# Patient Record
Sex: Female | Born: 1985 | Race: Black or African American | Hispanic: No | Marital: Single | Smoking: Current every day smoker
Health system: Southern US, Community
[De-identification: ages and names within clinical notes are randomized; demographics above are authoritative.]

## PROBLEM LIST (undated history)

## (undated) DIAGNOSIS — I10 Essential (primary) hypertension: Secondary | ICD-10-CM

## (undated) HISTORY — DX: Essential (primary) hypertension: I10

## (undated) HISTORY — PX: NO PAST SURGERIES: SHX2092

---

## 2018-02-16 ENCOUNTER — Ambulatory Visit (HOSPITAL_COMMUNITY)
Admission: EM | Admit: 2018-02-16 | Discharge: 2018-02-16 | Disposition: A | Discharge status: Home / Self Care | Payer: Medicaid Other | Attending: Nurse Practitioner | Admitting: Nurse Practitioner

## 2018-02-16 ENCOUNTER — Other Ambulatory Visit: Payer: Self-pay

## 2018-02-16 ENCOUNTER — Encounter (HOSPITAL_COMMUNITY): Payer: Self-pay

## 2018-02-16 DIAGNOSIS — N898 Other specified noninflammatory disorders of vagina: Secondary | ICD-10-CM | POA: Diagnosis present

## 2018-02-16 LAB — POCT URINALYSIS DIP (DEVICE)
BILIRUBIN URINE: NEGATIVE
GLUCOSE, UA: NEGATIVE mg/dL
KETONES UR: NEGATIVE mg/dL
Leukocytes, UA: NEGATIVE
Nitrite: NEGATIVE
Protein, ur: NEGATIVE mg/dL
SPECIFIC GRAVITY, URINE: 1.02 (ref 1.005–1.030)
Urobilinogen, UA: 1 mg/dL (ref 0.0–1.0)
pH: 7 (ref 5.0–8.0)

## 2018-02-16 MED ORDER — FLUCONAZOLE 150 MG PO TABS: 150.0000 mg | ORAL_TABLET | Freq: Every day | ORAL | 0 refills | Status: DC

## 2018-02-16 MED ORDER — METRONIDAZOLE 500 MG PO TABS: 500.0000 mg | ORAL_TABLET | Freq: Two times a day (BID) | ORAL | 0 refills | Status: DC

## 2018-02-16 NOTE — ED Provider Notes (Signed)
[facility]    CSN: [id number] Arrival date & time: 02/16/18  1641     History   Chief Complaint Chief Complaint  Patient presents with  . Vaginal Discharge    HPI Toni Maynard is a 32 y.o. female.   Subjective:   Toni Maynard is a 32 y.o. female who presents for evaluation for abnormal smelling urine, dysuria, urinary frequency, and vaginal discharge (white, thick and malodorous). Symptoms have been present for 1-2 weeks. She denies any blisters, bumps, burning, lesions, vulvar itching, back pain, fever, stomach ache or flank pain. Menstrual pattern: bleeding regularly. Contraception: none. STI Risk: Very low risk of STD exposure        History reviewed. No pertinent past medical history.  There are no active problems to display for this patient.   History reviewed. No pertinent surgical history.  OB History   None      Home Medications    Prior to Admission medications   Medication Sig Start Date End Date Taking? Authorizing Provider  fluconazole (DIFLUCAN) 150 MG tablet Take 1 tablet (150 mg total) by mouth daily. Take 1 tablet by mouth once now and repeat once in 7 days after completing antibiotics 02/16/18   Lurline Idol, FNP  metroNIDAZOLE (FLAGYL) 500 MG tablet Take 1 tablet (500 mg total) by mouth 2 (two) times daily. 02/16/18   Lurline Idol, FNP    Family History History reviewed. No pertinent family history.  Social History Social History   Tobacco Use  . Smoking status: Never Smoker  . Smokeless tobacco: Never Used  Substance Use Topics  . Alcohol use: Not on file  . Drug use: Not on file     Allergies   Patient has no allergy information on record.   Review of Systems Review of Systems  Constitutional: Negative for fever.  Gastrointestinal: Negative for nausea and vomiting.  Genitourinary: Positive for dysuria, frequency and vaginal discharge. Negative for flank pain, genital sores, hematuria, urgency  and vaginal pain.  Musculoskeletal: Negative for back pain.  All other systems reviewed and are negative.    Physical Exam Triage Vital Signs ED Triage Vitals  Enc Vitals Group     BP 02/16/18 1757 (!) 162/95     Pulse --      Resp 02/16/18 1757 16     Temp 02/16/18 1757 98.2 F (36.8 C)     Temp Source 02/16/18 1757 Oral     SpO2 02/16/18 1757 100 %     Weight 02/16/18 1759 157 lb 3.2 oz (71.3 kg)     Height --      Head Circumference --      Peak Flow --      Pain Score --      Pain Loc --      Pain Edu? --      Excl. in GC? --    No data found.  Updated Vital Signs BP (!) 162/95 (BP Location: Left Arm)   Temp 98.2 F (36.8 C) (Oral)   Resp 16   Wt 157 lb 3.2 oz (71.3 kg)   LMP 01/12/2018   SpO2 100%   Visual Acuity Right Eye Distance:   Left Eye Distance:   Bilateral Distance:    Right Eye Near:   Left Eye Near:    Bilateral Near:     Physical Exam  Constitutional: She is oriented to person, place, and time. She appears well-developed and well-nourished.  Neck: Normal range of motion.  Neck supple.  Cardiovascular: Normal rate and regular rhythm.  Pulmonary/Chest: Effort normal and breath sounds normal.  Musculoskeletal: Normal range of motion.  Neurological: She is alert and oriented to person, place, and time.  Skin: Skin is warm and dry.  Psychiatric: She has a normal mood and affect.     UC Treatments / Results  Labs (all labs ordered are listed, but only abnormal results are displayed) Labs Reviewed  POCT URINALYSIS DIP (DEVICE) - Abnormal; Notable for the following components:      Result Value   Hgb urine dipstick TRACE (*)    All other components within normal limits  URINE CYTOLOGY ANCILLARY ONLY    EKG None  Radiology No results found.  Procedures Procedures (including critical care time)  Medications Ordered in UC Medications - No data to display  Initial Impression / Assessment and Plan / UC Course  I have reviewed the  triage vital signs and the nursing notes.  Pertinent labs & imaging results that were available during my care of the patient were reviewed by me and considered in my medical decision making (see chart for details).     32 yo sexually active female presenting with UTI/vaginitis type symptoms.  Urine dipstick negative for all components.  Cytology pending for gonorrhea/chlamydia, trichomonas, yeast and gardnerella.  Plan:   Flagyl BID x 7 days  Diflucan 150 mg x 1 now and repeat once in 7 days. Abstinence from intercourse discussed. Discussed safe sex. Maintain adequate hydration Follow up if symptoms not improving, and prn.  Today's evaluation has revealed no signs of a dangerous process. Discussed diagnosis with patient. Patient aware of their diagnosis, possible red flag symptoms to watch out for and need for close follow up. Patient understands verbal and written discharge instructions. Patient comfortable with plan and disposition.  Patient has a clear mental status at this time, good insight into illness (after discussion and teaching) and has clear judgment to make decisions regarding their care.  Documentation was completed with the aid of voice recognition software. Transcription may contain typographical errors.    Final Clinical Impressions(s) / UC Diagnoses   Final diagnoses:  Vaginal discharge   Discharge Instructions   None    ED Prescriptions    Medication Sig Dispense Auth. Provider   metroNIDAZOLE (FLAGYL) 500 MG tablet Take 1 tablet (500 mg total) by mouth 2 (two) times daily. 14 tablet Lurline Idol, FNP   fluconazole (DIFLUCAN) 150 MG tablet Take 1 tablet (150 mg total) by mouth daily. Take 1 tablet by mouth once now and repeat once in 7 days after completing antibiotics 2 tablet Lurline Idol, FNP     Controlled Substance Prescriptions [place] Controlled Substance Registry consulted? Not Applicable   Lurline Idol, [place] 02/16/18 1918

## 2018-02-16 NOTE — Discharge Instructions (Addendum)
Take medications as prescribed. Testing for other infections is pending. You will only be notified for positive results. You may go online on 'my chart' to look at all your test results.

## 2018-02-16 NOTE — ED Triage Notes (Signed)
Pt states she has a vaginal discharge x 1 week

## 2018-02-17 LAB — URINE CYTOLOGY ANCILLARY ONLY
CHLAMYDIA, DNA PROBE: NEGATIVE
NEISSERIA GONORRHEA: NEGATIVE
TRICH (WINDOWPATH): NEGATIVE

## 2018-02-20 LAB — URINE CYTOLOGY ANCILLARY ONLY
Bacterial vaginitis: NEGATIVE
CANDIDA VAGINITIS: NEGATIVE

## 2018-05-14 ENCOUNTER — Other Ambulatory Visit: Payer: Self-pay

## 2018-05-14 ENCOUNTER — Encounter (HOSPITAL_COMMUNITY): Payer: Self-pay | Admitting: Emergency Medicine

## 2018-05-14 ENCOUNTER — Emergency Department (HOSPITAL_COMMUNITY)
Admission: EM | Admit: 2018-05-14 | Discharge: 2018-05-15 | Disposition: A | Discharge status: Home / Self Care | Payer: Medicaid Other | Attending: Emergency Medicine | Admitting: Emergency Medicine

## 2018-05-14 DIAGNOSIS — Z5321 Procedure and treatment not carried out due to patient leaving prior to being seen by health care provider: Secondary | ICD-10-CM | POA: Diagnosis not present

## 2018-05-14 DIAGNOSIS — R109 Unspecified abdominal pain: Secondary | ICD-10-CM | POA: Diagnosis not present

## 2018-05-14 NOTE — ED Triage Notes (Addendum)
C/o RLQ pain x 2 days.  Reports 1 episode of diarrhea yesterday and vomited x 1 tonight.  Denies urinary complaints.  States LMP started on 1/1 and lasted 3 days.

## 2018-05-15 LAB — COMPREHENSIVE METABOLIC PANEL
ALK PHOS: 39 U/L (ref 38–126)
ALT: 7 U/L (ref 0–44)
AST: 10 U/L — AB (ref 15–41)
Albumin: 3.9 g/dL (ref 3.5–5.0)
Anion gap: 8 (ref 5–15)
BILIRUBIN TOTAL: 0.5 mg/dL (ref 0.3–1.2)
BUN: <5 mg/dL — ABNORMAL LOW (ref 6–20)
CALCIUM: 8.8 mg/dL — AB (ref 8.9–10.3)
CO2: 24 mmol/L (ref 22–32)
Chloride: 107 mmol/L (ref 98–111)
Creatinine, Ser: 0.74 mg/dL (ref 0.44–1.00)
GLUCOSE: 101 mg/dL — AB (ref 70–99)
POTASSIUM: 3.2 mmol/L — AB (ref 3.5–5.1)
Sodium: 139 mmol/L (ref 135–145)
Total Protein: 7.3 g/dL (ref 6.5–8.1)

## 2018-05-15 LAB — I-STAT BETA HCG BLOOD, ED (MC, WL, AP ONLY)

## 2018-05-15 LAB — URINALYSIS, ROUTINE W REFLEX MICROSCOPIC
BILIRUBIN URINE: NEGATIVE
Glucose, UA: NEGATIVE mg/dL
HGB URINE DIPSTICK: NEGATIVE
Ketones, ur: NEGATIVE mg/dL
Nitrite: NEGATIVE
PROTEIN: NEGATIVE mg/dL
SPECIFIC GRAVITY, URINE: 1.015 (ref 1.005–1.030)
pH: 7 (ref 5.0–8.0)

## 2018-05-15 LAB — CBC
HCT: 28.2 % — ABNORMAL LOW (ref 36.0–46.0)
HEMOGLOBIN: 8.5 g/dL — AB (ref 12.0–15.0)
MCH: 21.4 pg — AB (ref 26.0–34.0)
MCHC: 30.1 g/dL (ref 30.0–36.0)
MCV: 71 fL — AB (ref 80.0–100.0)
PLATELETS: 226 10*3/uL (ref 150–400)
RBC: 3.97 MIL/uL (ref 3.87–5.11)
RDW: 21 % — ABNORMAL HIGH (ref 11.5–15.5)
WBC: 6.2 10*3/uL (ref 4.0–10.5)
nRBC: 0 % (ref 0.0–0.2)

## 2018-05-15 LAB — LIPASE, BLOOD: Lipase: 27 U/L (ref 11–51)

## 2018-05-15 NOTE — ED Notes (Signed)
Called pt last name x4 in lobby. No response from pt.

## 2018-05-25 ENCOUNTER — Ambulatory Visit
Admission: EM | Admit: 2018-05-25 | Discharge: 2018-05-25 | Disposition: A | Discharge status: Home / Self Care | Payer: Medicaid Other

## 2018-05-25 ENCOUNTER — Encounter: Payer: Self-pay | Admitting: Emergency Medicine

## 2018-05-25 DIAGNOSIS — H5440 Blindness, one eye, unspecified eye: Secondary | ICD-10-CM

## 2018-05-25 NOTE — ED Notes (Signed)
Patient able to ambulate independently  

## 2018-05-25 NOTE — ED Provider Notes (Addendum)
05/25/2018 5:57 PM   DOB: 10/17/1985 / MRN: [id number]  SUBJECTIVE:  Toni Maynard is a 33 y.o. female presenting for complete vision loss in the right eye that started about a month ago.  This was preceded by 2 weeks of progressively blurry vision.  Patient denies eye pain.  She saw Dr. at one point who told her that she had a cataract.  She has no allergies on file.   She  has no past medical history on file.    She  reports that she has been smoking. She has never used smokeless tobacco. She reports current alcohol use. She reports previous drug use. She  has no history on file for sexual activity. The patient  has no past surgical history on file.  Her family history is not on file.  Review of Systems  Constitutional: Negative for chills, diaphoresis and fever.  Gastrointestinal: Negative for nausea.  Skin: Negative for rash.  Neurological: Negative for dizziness.    OBJECTIVE:  BP 116/80 (BP Location: Left Arm)   Pulse 75   Temp 97.7 F (36.5 C)   Resp 18   LMP 05/12/2018   SpO2 98%   Wt Readings from Last 3 Encounters:  05/14/18 150 lb (68 kg)  02/16/18 157 lb 3.2 oz (71.3 kg)   Temp Readings from Last 3 Encounters:  05/25/18 97.7 F (36.5 C)  05/14/18 99.3 F (37.4 C) (Oral)  02/16/18 98.2 F (36.8 C) (Oral)   BP Readings from Last 3 Encounters:  05/25/18 116/80  05/14/18 (!) 156/96  02/16/18 (!) 162/95   Pulse Readings from Last 3 Encounters:  05/25/18 75  05/14/18 70    Physical Exam Vitals signs reviewed.  Constitutional:      General: She is not in acute distress.    Appearance: She is not diaphoretic.  Eyes:     Pupils: Pupils are equal, round, and reactive to light.   Cardiovascular:     Rate and Rhythm: Normal rate.  Pulmonary:     Effort: Pulmonary effort is normal.  Abdominal:     General: There is no distension.  Skin:    General: Skin is dry.  Neurological:     Mental Status: She is alert and oriented to person, place, and  time.     Cranial Nerves: No cranial nerve deficit.     Gait: Gait normal.     No results found for this or any previous visit (from the past 72 hour(s)).  No results found.  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 Cammie Mcgee office first thing tomorrow morning.  His telephone number is [phone].  If you feel that you need a more urgent evaluation please go to the ED tonight.        The patient is advised to call or return to clinic if she does not see an improvement in symptoms, or to seek the care of the closest emergency department if she worsens with the above plan.   Deliah Boston, MHS, PA-C 05/25/2018 5:57 PM   Ofilia Neas, PA-C 05/25/18 1757    Ofilia Neas, PA-C 05/25/18 1758

## 2018-05-25 NOTE — ED Triage Notes (Signed)
Pt presents to [facility] for assessment of lightened right pupil x 1 month with vision loss.

## 2018-05-25 NOTE — Discharge Instructions (Addendum)
Please call Dr. Cammie Mcgee office first thing tomorrow morning.  His telephone number is [phone].  If you feel that you need a more urgent evaluation please go to the ED tonight.

## 2018-10-14 LAB — OB RESULTS CONSOLE HGB/HCT, BLOOD
HCT: 26 — AB (ref 29–41)
Hemoglobin: 8.1
Hemoglobin: 8.7

## 2018-10-14 LAB — OB RESULTS CONSOLE HEPATITIS B SURFACE ANTIGEN: Hepatitis B Surface Ag: NEGATIVE

## 2018-10-14 LAB — OB RESULTS CONSOLE GC/CHLAMYDIA
Chlamydia: NEGATIVE
Gonorrhea: NEGATIVE

## 2018-10-14 LAB — DRUG SCREEN, URINE: Drug Screen, Urine: NEGATIVE

## 2018-10-14 LAB — OB RESULTS CONSOLE VARICELLA ZOSTER ANTIBODY, IGG: Varicella: IMMUNE

## 2018-10-14 LAB — CYTOLOGY - PAP: Pap: NEGATIVE

## 2018-10-14 LAB — OB RESULTS CONSOLE ANTIBODY SCREEN: Antibody Screen: NEGATIVE

## 2018-10-14 LAB — OB RESULTS CONSOLE RUBELLA ANTIBODY, IGM: Rubella: IMMUNE

## 2018-10-14 LAB — OB RESULTS CONSOLE ABO/RH: RH Type: POSITIVE

## 2018-10-14 LAB — URINE CULTURE: Urine Culture, OB: NO GROWTH

## 2018-10-14 LAB — GLUCOSE, 1 HOUR: Glucose 1 Hour: 70

## 2018-10-14 LAB — OB RESULTS CONSOLE PLATELET COUNT: Platelets: 202

## 2018-10-14 LAB — OB RESULTS CONSOLE RPR: RPR: NONREACTIVE

## 2018-10-18 LAB — VITAMIN B12
Ferritin: 8
VITAMIN B12: 407

## 2018-10-19 ENCOUNTER — Encounter: Payer: Self-pay | Admitting: *Deleted

## 2018-10-19 ENCOUNTER — Telehealth: Payer: Self-pay | Admitting: Family Medicine

## 2018-10-19 ENCOUNTER — Other Ambulatory Visit (HOSPITAL_COMMUNITY): Payer: Self-pay

## 2018-10-19 NOTE — Telephone Encounter (Signed)
Called and spoke to patient she is aware of her virtual mychart visit

## 2018-10-20 ENCOUNTER — Encounter: Payer: Self-pay | Admitting: Obstetrics and Gynecology

## 2018-10-20 ENCOUNTER — Telehealth: Payer: Self-pay | Admitting: Obstetrics and Gynecology

## 2018-10-20 ENCOUNTER — Telehealth (INDEPENDENT_AMBULATORY_CARE_PROVIDER_SITE_OTHER): Payer: Medicaid Other | Admitting: Obstetrics and Gynecology

## 2018-10-20 DIAGNOSIS — O0930 Supervision of pregnancy with insufficient antenatal care, unspecified trimester: Secondary | ICD-10-CM | POA: Insufficient documentation

## 2018-10-20 DIAGNOSIS — O09529 Supervision of elderly multigravida, unspecified trimester: Secondary | ICD-10-CM

## 2018-10-20 DIAGNOSIS — Z3A25 25 weeks gestation of pregnancy: Secondary | ICD-10-CM | POA: Diagnosis not present

## 2018-10-20 DIAGNOSIS — O0932 Supervision of pregnancy with insufficient antenatal care, second trimester: Secondary | ICD-10-CM | POA: Diagnosis not present

## 2018-10-20 DIAGNOSIS — Z641 Problems related to multiparity: Secondary | ICD-10-CM | POA: Diagnosis not present

## 2018-10-20 DIAGNOSIS — O09522 Supervision of elderly multigravida, second trimester: Secondary | ICD-10-CM

## 2018-10-20 DIAGNOSIS — N3000 Acute cystitis without hematuria: Secondary | ICD-10-CM

## 2018-10-20 DIAGNOSIS — O0990 Supervision of high risk pregnancy, unspecified, unspecified trimester: Secondary | ICD-10-CM

## 2018-10-20 DIAGNOSIS — O10013 Pre-existing essential hypertension complicating pregnancy, third trimester: Secondary | ICD-10-CM | POA: Diagnosis not present

## 2018-10-20 DIAGNOSIS — I10 Essential (primary) hypertension: Secondary | ICD-10-CM | POA: Insufficient documentation

## 2018-10-20 DIAGNOSIS — F17200 Nicotine dependence, unspecified, uncomplicated: Secondary | ICD-10-CM | POA: Insufficient documentation

## 2018-10-20 MED ORDER — ASPIRIN EC 81 MG PO TBEC: 81.0000 mg | DELAYED_RELEASE_TABLET | Freq: Every day | ORAL | 2 refills | Status: DC

## 2018-10-20 MED ORDER — CEPHALEXIN 500 MG PO CAPS: 500.0000 mg | ORAL_CAPSULE | Freq: Four times a day (QID) | ORAL | 2 refills | Status: DC

## 2018-10-20 MED ORDER — PREPLUS 27-1 MG PO TABS: 1.0000 | ORAL_TABLET | Freq: Every day | ORAL | 13 refills | Status: DC

## 2018-10-20 MED ORDER — AMBULATORY NON FORMULARY MEDICATION: 1.0000 | 0 refills | Status: DC

## 2018-10-20 NOTE — Progress Notes (Signed)
I connected with  Toni Maynard on 10/20/18 at  1:35 PM EDT by telephone and verified that I am speaking with the correct person using two identifiers.   I discussed the limitations, risks, security and privacy concerns of performing an evaluation and management service by telephone and the availability of in person appointments. I also discussed with the patient that there may be a patient responsible charge related to this service. The patient expressed understanding and agreed to proceed.  Aviva Signs Diezel Mazur, CMA 10/20/2018  1:25 PM.  Send antibiotics for UTI & prenatals to pharmacy [facility]. ([facility])

## 2018-10-20 NOTE — Telephone Encounter (Signed)
Attempted to call patient with her next ob appointment 6/25 @ 2:15. No answer, left detailed voicemail with appointment information being a virtual visit. Patient already has app downloaded from visit on 6/10. Reminder mailed

## 2018-10-20 NOTE — Progress Notes (Signed)
TELEHEALTH INITIAL PRENATAL OBSTETRICS PRENATAL VIRTUAL VIDEO VISIT ENCOUNTER NOTE  Provider location: [facility] for [place] [facility]   I connected with Toni Maynard on 10/20/18 at  1:35 PM EDT by MyChart Video Encounter at home and verified that I am speaking with the correct person using two identifiers.   I discussed the limitations, risks, security and privacy concerns of performing an evaluation and management service by telephone and the availability of in person appointments. I also discussed with the patient that there may be a patient responsible charge related to this service. The patient expressed understanding and agreed to proceed.  Subjective:  Toni Maynard is a 33 y.o. [id number] at 91w6dby 260w [place] seen today for her initial prenatal visit. [facility] is an un[facility]. She and partner are happy with the pregnancy. She was using condoms for birth control previously. She has an obstetric history significant for 8 x SVD, h/o IOL for 2 x gHTN (last two pregnancies). She has a medical history significant for n/a. Was on labetalol prior to delivery with last pregnancy, doesn't think she was ever on magnesium therapy. Was on labetalol after delivery.  Patient reports fatigue.  Contractions: Not present. Vag. Bleeding: None.  Movement: Present. Denies leaking of fluid.   History reviewed. No pertinent past medical history.  History reviewed. No pertinent surgical history.  OB History  Gravida Para Term Preterm AB Living  '10 8 7 1 ' 0 8  SAB TAB Ectopic Multiple Live Births  0 0 0 0 8    # Outcome Date GA Lbr Len/2nd Weight Sex Delivery Anes PTL Lv  10 Current           9 Preterm 07/27/17   4 lb 6 oz (1.984 kg) F Vag-Spont  N LIV  8 Term 10/14/15 354w0d5 lb 1 oz (2.296 kg) M Vag-Spont  N LIV  7 Term 07/13/11 4052w0d lb 8 oz (2.495 kg) M Vag-Spont EPI N LIV  6 Term 05/28/10 40w3w0dlb 5 oz (2.863 kg) F Vag-Spont    LIV  5 Term 03/22/07 40w025w0db 9 oz (2.523 kg) F Vag-Spont   LIV  4 Term 06/06/05 76w0d27w0d 1 oz (3.204 kg) M Vag-Spont  N LIV  3 Term 03/25/04 [redacted]w[redacted]d 30w0d6 oz (3.345 kg) F Vag-Spont  N LIV  2 Term 12/25/02 [redacted]w[redacted]d  [redacted]w[redacted]d oz (2.92 kg) M Vag-Spont  N LIV  1 Gravida             Social History   Socioeconomic History  . Marital status: Single    Spouse name: Not on file  . Number of children: Not on file  . Years of education: Not on file  . Highest education level: Not on file  Occupational History  . Not on file  Social Needs  . Financial resource strain: Not on file  . Food insecurity:    Worry: Not on file    Inability: Not on file  . Transportation needs:    Medical: Not on file    Non-medical: Not on file  Tobacco Use  . Smoking status: Current Every Day Smoker  . Smokeless tobacco: Never Used  Substance and Sexual Activity  . Alcohol use: Yes    Frequency: Never  . Drug use: Not Currently  . Sexual activity: Not on file  Lifestyle  . Physical  activity:    Days per week: Not on file    Minutes per session: Not on file  . Stress: Not on file  Relationships  . Social connections:    Talks on phone: Not on file    Gets together: Not on file    Attends religious service: Not on file    Active member of club or organization: Not on file    Attends meetings of clubs or organizations: Not on file    Relationship status: Not on file  Other Topics Concern  . Not on file  Social History Narrative  . Not on file   History reviewed. No pertinent family history.   Current Outpatient Medications:  .  AMBULATORY NON FORMULARY MEDICATION, 1 Device by Other route once a week. Blood Pressure Cuff/ Medium Monitored Regularly at home ICD 10: O09.90, Disp: 1 kit, Rfl: 0 .  aspirin EC 81 MG tablet, Take 1 tablet (81 mg total) by mouth daily. Take after 12 weeks for prevention of preeclampsia later in pregnancy, Disp: 300 tablet, Rfl: 2 .  cephALEXin (KEFLEX) 500 MG capsule,  Take 1 capsule (500 mg total) by mouth 4 (four) times daily., Disp: 28 capsule, Rfl: 2 .  fluconazole (DIFLUCAN) 150 MG tablet, Take 1 tablet (150 mg total) by mouth daily. Take 1 tablet by mouth once now and repeat once in 7 days after completing antibiotics, Disp: 2 tablet, Rfl: 0 .  metroNIDAZOLE (FLAGYL) 500 MG tablet, Take 1 tablet (500 mg total) by mouth 2 (two) times daily., Disp: 14 tablet, Rfl: 0 .  Prenatal Vit-Fe Fumarate-FA (PREPLUS) 27-1 MG TABS, Take 1 tablet by mouth daily., Disp: 30 tablet, Rfl: 13  Not on File  Review of Systems: Negative except for what is mentioned in HPI.  Objective:  There were no vitals filed for this visit.  Fetal Status:     Movement: Present     General:  Alert, oriented and cooperative. Patient is in no acute distress.  Respiratory: Normal respiratory effort, no problems with respiration noted  Mental Status: Normal mood and affect. Normal behavior. Normal judgment and thought content.  Rest of physical exam deferred due to type of encounter  Imaging: No results found.  Assessment and Plan:  Pregnancy: [id number] at 61w6dby 24 weeks [place] 1. Supervision of high risk pregnancy, antepartum - AMBULATORY NON FORMULARY MEDICATION; 1 Device by Other route once a week. Blood Pressure Cuff/ Medium Monitored Regularly at home ICD 10: O09.90  Dispense: 1 kit; Refill: 0 - [place]FM OB DETAIL +14 WK; Future [facility] - sent info for contraception via mychart - would like contraception in hospital, depo or [facility], multiple providers, fellows, medical students, virtual visits, MyChart.  - next visit virtual, visit after will be 2 hr GTT/3rd trim labs  2. Antepartum multigravida of advanced maternal age - AMBULATORY NON FORMULARY MEDICATION; 1 Device by Other route once a week. Blood Pressure Cuff/ Medium Monitored Regularly at home ICD 10: O09.90  Dispense: 1 kit;  Refill: 0  3. Acute cystitis without hematuria - Dx at NOB - Keflex sent to pharmacy  4. Late prenatal care  5. [place]iparity  6. Chronic hypertension - Start baby ASA - based on labetalol use in last pregnancy and elevated BP outside of pregnancy - no meds - will need testing 34 weeks   Preterm labor symptoms and general obstetric precautions including  but not limited to vaginal bleeding, contractions, leaking of fluid and fetal movement were reviewed in detail with the patient. I discussed the assessment and treatment plan with the patient. The patient was provided an opportunity to ask questions and all were answered. The patient agreed with the plan and demonstrated an understanding of the instructions. The patient was advised to call back or seek an in-person office evaluation/go to MAU at [facility] for any urgent or concerning symptoms. Please refer to After Visit Summary for other counseling recommendations.   I provided 16 minutes of face-to-face time during this encounter.  Return in about 2 weeks (around 11/03/2018) for OB visit (MD), virtual.  Sloan Leiter 10/20/2018 1:56 PM

## 2018-10-22 ENCOUNTER — Encounter: Payer: Self-pay | Admitting: *Deleted

## 2018-10-22 DIAGNOSIS — I10 Essential (primary) hypertension: Secondary | ICD-10-CM

## 2018-10-25 ENCOUNTER — Encounter: Payer: Self-pay | Admitting: *Deleted

## 2018-11-03 ENCOUNTER — Encounter: Payer: Self-pay | Admitting: *Deleted

## 2018-11-03 ENCOUNTER — Telehealth: Payer: Self-pay | Admitting: Obstetrics & Gynecology

## 2018-11-03 NOTE — Telephone Encounter (Signed)
Called number in [place], and it's the number to the health dept.

## 2018-11-04 ENCOUNTER — Telehealth (INDEPENDENT_AMBULATORY_CARE_PROVIDER_SITE_OTHER): Payer: Medicaid Other | Admitting: Obstetrics & Gynecology

## 2018-11-04 ENCOUNTER — Other Ambulatory Visit: Payer: Self-pay

## 2018-11-04 VITALS — BP 131/91 | HR 64

## 2018-11-04 DIAGNOSIS — O0993 Supervision of high risk pregnancy, unspecified, third trimester: Secondary | ICD-10-CM

## 2018-11-04 DIAGNOSIS — O0990 Supervision of high risk pregnancy, unspecified, unspecified trimester: Secondary | ICD-10-CM

## 2018-11-04 DIAGNOSIS — O10013 Pre-existing essential hypertension complicating pregnancy, third trimester: Secondary | ICD-10-CM

## 2018-11-04 DIAGNOSIS — Z3A28 28 weeks gestation of pregnancy: Secondary | ICD-10-CM | POA: Diagnosis not present

## 2018-11-04 DIAGNOSIS — I10 Essential (primary) hypertension: Secondary | ICD-10-CM

## 2018-11-04 NOTE — Patient Instructions (Signed)
Return to office for any scheduled appointments. Call the office or go to the MAU at [facility] [place] if:  You begin to have strong, frequent contractions  Your water breaks.  Sometimes it is a big gush of fluid, sometimes it is just a trickle that keeps getting your panties wet or running down your legs  You have vaginal bleeding.  It is normal to have a small amount of spotting if your cervix was checked.   You do not feel your baby moving like normal.  If you do not, get something to eat and drink and lay down and focus on feeling your baby move.   If your baby is still not moving like normal, you should call the office or go to MAU.  Any other obstetric concerns.   

## 2018-11-04 NOTE — Progress Notes (Signed)
Called pt and LVM informing pt of anatomy ultrasound scheduled for 6/29 @ 9:30 am. Pt was also told to download babyscripts. Pt was instructed to give the office a call if she had any further questions or concerns.

## 2018-11-04 NOTE — Progress Notes (Signed)
   [place] VIRTUAL VIDEO VISIT ENCOUNTER NOTE  Provider location: [facility] for [place] at [facility]   I connected with Leona Singleton on 11/04/18 at  2:15 PM EDT by MyChart Video Encounter at home and verified that I am speaking with the correct person using two identifiers.   I discussed the limitations, risks, security and privacy concerns of performing an evaluation and management service by telephone and the availability of in person appointments. I also discussed with the patient that there may be a patient responsible charge related to this service. The patient expressed understanding and agreed to proceed. Subjective:  Toni Maynard is a 33 y.o. [id number] at [redacted]w[redacted]d being seen today for ongoing prenatal care.  She is currently monitored for the following issues for this high-risk pregnancy and has Supervision of high risk pregnancy, antepartum; Advanced maternal age in multigravida; Late prenatal care; [place] multiparity; Chronic hypertension; and Smoker on their problem list.  Patient reports no complaints.  Contractions: Not present. Vag. Bleeding: None.  Movement: Present. Denies any leaking of fluid.   The following portions of the patient's history were reviewed and updated as appropriate: allergies, current medications, past family history, past medical history, past social history, past surgical history and problem list.   Objective:   Vitals:   11/04/18 1319  BP: (!) 131/91  Pulse: 64    Fetal Status:     Movement: Present     General:  Alert, oriented and cooperative. Patient is in no acute distress.  Respiratory: Normal respiratory effort, no problems with respiration noted  Mental Status: Normal mood and affect. Normal behavior. Normal judgment and thought content.  Rest of physical exam deferred due to type of encounter  Imaging: No results found.  Assessment and Plan:  Pregnancy: [id number] at [redacted]w[redacted]d 1. Chronic hypertension  Will follow up BP closely, preeclampsia precautions reviewed. Will get growth scan. Antenatal testing to start in 32 weeks.  - Babyscripts Schedule Optimization - Enroll Patient in Babyscripts - [place] MFM OB DETAIL +14 WK; Future  2. Supervision of high risk pregnancy, antepartum Preterm labor symptoms and general obstetric precautions including but not limited to vaginal bleeding, contractions, leaking of fluid and fetal movement were reviewed in detail with the patient. I discussed the assessment and treatment plan with the patient. The patient was provided an opportunity to ask questions and all were answered. The patient agreed with the plan and demonstrated an understanding of the instructions. The patient was advised to call back or seek an in-person office evaluation/go to MAU at [facility] for any urgent or concerning symptoms. Please refer to After Visit Summary for other counseling recommendations.   I provided 15 minutes of face-to-face time during this encounter.  Return in about 2 weeks (around 11/18/2018) for Virtual OB Visit.  Future Appointments  Date Time Provider [place]  11/04/2018  2:15 PM Mathew Storck, Sallyanne Havers, MD [place]    Verita Schneiders, MD Center for [facility], [place]

## 2018-11-08 ENCOUNTER — Other Ambulatory Visit (HOSPITAL_COMMUNITY): Payer: Self-pay | Admitting: *Deleted

## 2018-11-08 ENCOUNTER — Ambulatory Visit (HOSPITAL_COMMUNITY): Payer: Medicaid Other | Admitting: *Deleted

## 2018-11-08 ENCOUNTER — Ambulatory Visit (HOSPITAL_COMMUNITY)
Admission: RE | Admit: 2018-11-08 | Discharge: 2018-11-08 | Disposition: A | Discharge status: Home / Self Care | Payer: Medicaid Other | Source: Ambulatory Visit | Attending: Obstetrics & Gynecology | Admitting: Obstetrics & Gynecology

## 2018-11-08 ENCOUNTER — Other Ambulatory Visit: Payer: Self-pay | Admitting: Obstetrics & Gynecology

## 2018-11-08 ENCOUNTER — Encounter: Payer: Self-pay | Admitting: Obstetrics & Gynecology

## 2018-11-08 ENCOUNTER — Other Ambulatory Visit: Payer: Self-pay

## 2018-11-08 ENCOUNTER — Encounter (HOSPITAL_COMMUNITY): Payer: Self-pay

## 2018-11-08 VITALS — BP 124/76 | HR 71 | Temp 98.5°F

## 2018-11-08 DIAGNOSIS — O10919 Unspecified pre-existing hypertension complicating pregnancy, unspecified trimester: Secondary | ICD-10-CM

## 2018-11-08 DIAGNOSIS — Z3A28 28 weeks gestation of pregnancy: Secondary | ICD-10-CM

## 2018-11-08 DIAGNOSIS — O0943 Supervision of pregnancy with grand multiparity, third trimester: Secondary | ICD-10-CM | POA: Diagnosis not present

## 2018-11-08 DIAGNOSIS — O99013 Anemia complicating pregnancy, third trimester: Secondary | ICD-10-CM | POA: Insufficient documentation

## 2018-11-08 DIAGNOSIS — Z641 Problems related to multiparity: Secondary | ICD-10-CM

## 2018-11-08 DIAGNOSIS — O09293 Supervision of pregnancy with other poor reproductive or obstetric history, third trimester: Secondary | ICD-10-CM

## 2018-11-08 DIAGNOSIS — O0933 Supervision of pregnancy with insufficient antenatal care, third trimester: Secondary | ICD-10-CM | POA: Diagnosis not present

## 2018-11-08 DIAGNOSIS — O09213 Supervision of pregnancy with history of pre-term labor, third trimester: Secondary | ICD-10-CM

## 2018-11-08 DIAGNOSIS — O09523 Supervision of elderly multigravida, third trimester: Secondary | ICD-10-CM

## 2018-11-08 DIAGNOSIS — I10 Essential (primary) hypertension: Secondary | ICD-10-CM | POA: Diagnosis not present

## 2018-11-08 DIAGNOSIS — O10013 Pre-existing essential hypertension complicating pregnancy, third trimester: Secondary | ICD-10-CM | POA: Diagnosis not present

## 2018-11-08 DIAGNOSIS — O99333 Smoking (tobacco) complicating pregnancy, third trimester: Secondary | ICD-10-CM

## 2018-11-08 DIAGNOSIS — Z363 Encounter for antenatal screening for malformations: Secondary | ICD-10-CM

## 2018-11-08 NOTE — Progress Notes (Signed)
Anemia Management     Component Value Date/Time   HGB 8.5 05/14/2018   HGB 8.1 10/14/2018   Was on oral iron therapy, Feraheme ordered given continued anemia in third trimester and increased risk of PPH given multiparity. Will recheck in about 4-6 weeks.   Toni Schneiders, MD, [place] for [employer], [place]

## 2018-11-11 ENCOUNTER — Telehealth (INDEPENDENT_AMBULATORY_CARE_PROVIDER_SITE_OTHER): Payer: Medicaid Other | Admitting: Family Medicine

## 2018-11-11 DIAGNOSIS — O99013 Anemia complicating pregnancy, third trimester: Secondary | ICD-10-CM

## 2018-11-11 NOTE — Telephone Encounter (Signed)
Called patient and reviewed appt information for Monday and directions. Patient verbalized understanding & states someone has already called her. Patient had no questions.

## 2018-11-11 NOTE — Telephone Encounter (Signed)
Patient has an appointment on Monday, and she needs to know what kind of appointment it is. Where she needs to go, and why she needs it. Please call back.

## 2018-11-15 ENCOUNTER — Other Ambulatory Visit: Payer: Self-pay

## 2018-11-15 ENCOUNTER — Ambulatory Visit (HOSPITAL_COMMUNITY)
Admission: RE | Admit: 2018-11-15 | Discharge: 2018-11-15 | Disposition: A | Discharge status: Home / Self Care | Payer: Medicaid Other | Source: Ambulatory Visit | Attending: Obstetrics & Gynecology | Admitting: Obstetrics & Gynecology

## 2018-11-15 DIAGNOSIS — O99013 Anemia complicating pregnancy, third trimester: Secondary | ICD-10-CM | POA: Diagnosis present

## 2018-11-15 MED ORDER — SODIUM CHLORIDE 0.9 % IV SOLN
510.0000 mg | INTRAVENOUS | Status: DC
  Administered 2018-11-15: 510 mg via INTRAVENOUS
  Filled 2018-11-15: qty 17

## 2018-11-17 ENCOUNTER — Encounter: Payer: Self-pay | Admitting: *Deleted

## 2018-11-17 ENCOUNTER — Telehealth: Payer: Self-pay | Admitting: Obstetrics & Gynecology

## 2018-11-17 NOTE — Telephone Encounter (Signed)
Left a voicemail message instructing the patient of how enter the virtual visit via mychart. Informed the patient of logging in 15 minutes prior to the appointment and if she has any questions please call our office at [phone]. °

## 2018-11-18 ENCOUNTER — Encounter: Payer: Self-pay | Admitting: Family Medicine

## 2018-11-18 ENCOUNTER — Telehealth (INDEPENDENT_AMBULATORY_CARE_PROVIDER_SITE_OTHER): Payer: Medicaid Other | Admitting: Family Medicine

## 2018-11-18 ENCOUNTER — Other Ambulatory Visit: Payer: Self-pay

## 2018-11-18 VITALS — BP 131/99

## 2018-11-18 DIAGNOSIS — O0993 Supervision of high risk pregnancy, unspecified, third trimester: Secondary | ICD-10-CM

## 2018-11-18 DIAGNOSIS — I10 Essential (primary) hypertension: Secondary | ICD-10-CM

## 2018-11-18 DIAGNOSIS — Z3A30 30 weeks gestation of pregnancy: Secondary | ICD-10-CM | POA: Diagnosis not present

## 2018-11-18 DIAGNOSIS — O163 Unspecified maternal hypertension, third trimester: Secondary | ICD-10-CM

## 2018-11-18 DIAGNOSIS — O0990 Supervision of high risk pregnancy, unspecified, unspecified trimester: Secondary | ICD-10-CM

## 2018-11-18 DIAGNOSIS — Z641 Problems related to multiparity: Secondary | ICD-10-CM | POA: Diagnosis not present

## 2018-11-18 NOTE — Patient Instructions (Signed)

## 2018-11-18 NOTE — Progress Notes (Signed)
Patient denies headaches, dizziness, or blurry vision

## 2018-11-18 NOTE — Progress Notes (Signed)
   TELEHEALTH VIRTUAL OBSTETRICS VISIT ENCOUNTER NOTE  I connected with Toni Maynard on 11/18/18 at 11:15 AM EDT by telephone (could not get MyChart to work) at home and verified that I am speaking with the correct person using two identifiers.   I discussed the limitations, risks, security and privacy concerns of performing an evaluation and management service by telephone and the availability of in person appointments. I also discussed with the patient that there may be a patient responsible charge related to this service. The patient expressed understanding and agreed to proceed.  Subjective:  Toni Maynard is a 33 y.o. [phone] at [redacted]w[redacted]d being followed for ongoing prenatal care.  She is currently monitored for the following issues for this high-risk pregnancy and has Supervision of high risk pregnancy, antepartum; Advanced maternal age in multigravida; Late prenatal care; [place] multiparity; Chronic hypertension; Smoker; and Anemia of pregnancy, third trimester on their problem list.  Patient reports no complaints. Reports fetal movement. Denies any contractions, bleeding or leaking of fluid.   The following portions of the patient's history were reviewed and updated as appropriate: allergies, current medications, past family history, past medical history, past social history, past surgical history and problem list.   Objective:   General:  Alert, oriented and cooperative.   Mental Status: Normal mood and affect perceived. Normal judgment and thought content.  Rest of physical exam deferred due to type of encounter  Assessment and Plan:  Pregnancy: [id number] at [redacted]w[redacted]d 1. Chronic hypertension BP reviewed and ok for [facility] On no meds--antenatal testing at 36 wks [employer] reviewed--not using, but will work on putting BPs in  2. Supervision of high risk pregnancy, antepartum Needs 2 hour  3. [place] multiparity   Preterm labor symptoms and general obstetric precautions  including but not limited to vaginal bleeding, contractions, leaking of fluid and fetal movement were reviewed in detail with the patient.  I discussed the assessment and treatment plan with the patient. The patient was provided an opportunity to ask questions and all were answered. The patient agreed with the plan and demonstrated an understanding of the instructions. The patient was advised to call back or seek an in-person office evaluation/go to MAU at [facility] for any urgent or concerning symptoms. Please refer to After Visit Summary for other counseling recommendations.   I provided 8 minutes of non-face-to-face time during this encounter.  Return in 2 weeks (on 12/02/2018) for virtual.  Future Appointments  Date Time Provider [place]  11/22/2018 11:00 AM [facility]-MD[facility] ROOM 9 [facility]-MD[facility] None  11/25/2018  8:15 AM Emily Filbert, MD WOC-WOCA WOC  11/25/2018  8:40 AM WOC-WOCA LAB WOC-WOCA WOC  12/02/2018  9:15 AM Lavonia Drafts, MD WOC-WOCA WOC  12/06/2018  2:00 PM Claude NURSE [place] MFC-US  12/06/2018  2:00 PM [place] 3 WH-MFCUS MFC-US  12/15/2018  9:15 AM Lavonia Drafts, MD WOC-WOCA WOC  12/22/2018  9:35 AM Emily Filbert, MD WOC-WOCA WOC  01/05/2019  8:15 AM WOC-WOCA NST WOC-WOCA WOC  01/05/2019  9:15 AM Donnamae Jude, MD House  01/12/2019  8:15 AM WOC-WOCA NST WOC-WOCA WOC  01/19/2019  8:15 AM WOC-WOCA NST WOC-WOCA WOC  01/26/2019  8:15 AM WOC-WOCA NST WOC-WOCA WOC    Donnamae Jude, MD Center for [place] [place] [facility]

## 2018-11-22 ENCOUNTER — Other Ambulatory Visit: Payer: Self-pay | Admitting: *Deleted

## 2018-11-22 ENCOUNTER — Ambulatory Visit (HOSPITAL_COMMUNITY)
Admission: RE | Admit: 2018-11-22 | Discharge: 2018-11-22 | Disposition: A | Discharge status: Home / Self Care | Payer: Medicaid Other | Source: Ambulatory Visit | Attending: Obstetrics & Gynecology | Admitting: Obstetrics & Gynecology

## 2018-11-22 ENCOUNTER — Other Ambulatory Visit: Payer: Self-pay

## 2018-11-22 DIAGNOSIS — O09529 Supervision of elderly multigravida, unspecified trimester: Secondary | ICD-10-CM

## 2018-11-22 DIAGNOSIS — O99013 Anemia complicating pregnancy, third trimester: Secondary | ICD-10-CM | POA: Diagnosis present

## 2018-11-22 DIAGNOSIS — O0990 Supervision of high risk pregnancy, unspecified, unspecified trimester: Secondary | ICD-10-CM

## 2018-11-22 MED ORDER — SODIUM CHLORIDE 0.9 % IV SOLN
510.0000 mg | INTRAVENOUS | Status: AC
  Administered 2018-11-22: 510 mg via INTRAVENOUS
  Filled 2018-11-22: qty 17

## 2018-11-25 ENCOUNTER — Encounter: Payer: Medicaid Other | Admitting: Obstetrics & Gynecology

## 2018-11-25 ENCOUNTER — Telehealth: Payer: Self-pay | Admitting: Obstetrics & Gynecology

## 2018-11-25 ENCOUNTER — Other Ambulatory Visit: Payer: Self-pay

## 2018-11-25 ENCOUNTER — Other Ambulatory Visit: Payer: Medicaid Other

## 2018-11-25 DIAGNOSIS — O0990 Supervision of high risk pregnancy, unspecified, unspecified trimester: Secondary | ICD-10-CM

## 2018-11-25 DIAGNOSIS — O09529 Supervision of elderly multigravida, unspecified trimester: Secondary | ICD-10-CM

## 2018-11-25 NOTE — Telephone Encounter (Signed)
Patient called in stating that she overslept for her appointment and wanted to know what she needed to do. Patient instructed that she would have to be rescheduled for her doctors appointment but she could still come in this morning to complete her 2 hr sugar test. Patient stated that she would like both of the visit together if possible. Patient instructed that our next available appointment was on 7/24 so I could cancel her virtual appointment on 7/23 and scheduled everything there. Patient stated that she did not want to do that. Patient instructed she could keep her appointment on 7/23 and come in today to go the sugar test. Patient stated that she would do that if she makes it here.

## 2018-11-26 LAB — RPR: RPR Ser Ql: NONREACTIVE

## 2018-11-26 LAB — CBC
Hematocrit: 27.8 % — ABNORMAL LOW (ref 34.0–46.6)
Hemoglobin: 9.1 g/dL — ABNORMAL LOW (ref 11.1–15.9)
MCH: 27.7 pg (ref 26.6–33.0)
MCHC: 32.7 g/dL (ref 31.5–35.7)
MCV: 85 fL (ref 79–97)
Platelets: 177 10*3/uL (ref 150–450)
RBC: 3.28 x10E6/uL — ABNORMAL LOW (ref 3.77–5.28)
RDW: 17.5 % — ABNORMAL HIGH (ref 11.7–15.4)
WBC: 11.4 10*3/uL — ABNORMAL HIGH (ref 3.4–10.8)

## 2018-11-26 LAB — GLUCOSE TOLERANCE, 2 HOURS W/ 1HR
Glucose, 1 hour: 129 mg/dL (ref 65–179)
Glucose, 2 hour: 95 mg/dL (ref 65–152)
Glucose, Fasting: 79 mg/dL (ref 65–91)

## 2018-11-26 LAB — HIV ANTIBODY (ROUTINE TESTING W REFLEX): HIV Screen 4th Generation wRfx: NONREACTIVE

## 2018-12-02 ENCOUNTER — Telehealth: Payer: Self-pay | Admitting: Obstetrics & Gynecology

## 2018-12-02 ENCOUNTER — Other Ambulatory Visit: Payer: Self-pay

## 2018-12-02 ENCOUNTER — Encounter: Payer: Medicaid Other | Admitting: Obstetrics & Gynecology

## 2018-12-02 ENCOUNTER — Encounter: Payer: Self-pay | Admitting: Obstetrics & Gynecology

## 2018-12-02 NOTE — Progress Notes (Signed)
0935-attempted to contact pt to begin virtual visit. LVM informing pt of attempt and encouraged pt to give the office a call.   0946- second attempt to contact pt. LVM regarding the need for the appointment to be rescheduled. Encouraged pt to give the office a phone call.

## 2018-12-02 NOTE — Telephone Encounter (Signed)
Attempted to call patient to get her rescheduled for her missed ob appointment on 7/23. No answer, left voicemail for patient to give the office a call back to be rescheduled. No show letter mailed.

## 2018-12-02 NOTE — Telephone Encounter (Signed)
Patient called to say she had missed her appointment. She knows she has an ultrasound appointment on Monday.

## 2018-12-06 ENCOUNTER — Ambulatory Visit (HOSPITAL_COMMUNITY): Payer: Medicaid Other

## 2018-12-07 ENCOUNTER — Ambulatory Visit (HOSPITAL_COMMUNITY)
Admission: RE | Admit: 2018-12-07 | Discharge: 2018-12-07 | Disposition: A | Discharge status: Home / Self Care | Payer: Medicaid Other | Source: Ambulatory Visit | Attending: Obstetrics and Gynecology | Admitting: Obstetrics and Gynecology

## 2018-12-07 ENCOUNTER — Ambulatory Visit (HOSPITAL_COMMUNITY): Payer: Medicaid Other | Admitting: *Deleted

## 2018-12-07 ENCOUNTER — Telehealth: Payer: Self-pay | Admitting: General Practice

## 2018-12-07 ENCOUNTER — Encounter (HOSPITAL_COMMUNITY): Payer: Self-pay | Admitting: *Deleted

## 2018-12-07 ENCOUNTER — Other Ambulatory Visit (HOSPITAL_COMMUNITY): Payer: Self-pay | Admitting: *Deleted

## 2018-12-07 ENCOUNTER — Other Ambulatory Visit: Payer: Self-pay

## 2018-12-07 DIAGNOSIS — O0943 Supervision of pregnancy with grand multiparity, third trimester: Secondary | ICD-10-CM

## 2018-12-07 DIAGNOSIS — O10013 Pre-existing essential hypertension complicating pregnancy, third trimester: Secondary | ICD-10-CM | POA: Diagnosis not present

## 2018-12-07 DIAGNOSIS — O0933 Supervision of pregnancy with insufficient antenatal care, third trimester: Secondary | ICD-10-CM

## 2018-12-07 DIAGNOSIS — O10919 Unspecified pre-existing hypertension complicating pregnancy, unspecified trimester: Secondary | ICD-10-CM | POA: Insufficient documentation

## 2018-12-07 DIAGNOSIS — Z363 Encounter for antenatal screening for malformations: Secondary | ICD-10-CM

## 2018-12-07 DIAGNOSIS — I10 Essential (primary) hypertension: Secondary | ICD-10-CM | POA: Insufficient documentation

## 2018-12-07 DIAGNOSIS — O99333 Smoking (tobacco) complicating pregnancy, third trimester: Secondary | ICD-10-CM

## 2018-12-07 DIAGNOSIS — O09523 Supervision of elderly multigravida, third trimester: Secondary | ICD-10-CM

## 2018-12-07 DIAGNOSIS — O09293 Supervision of pregnancy with other poor reproductive or obstetric history, third trimester: Secondary | ICD-10-CM

## 2018-12-07 DIAGNOSIS — O09213 Supervision of pregnancy with history of pre-term labor, third trimester: Secondary | ICD-10-CM

## 2018-12-07 DIAGNOSIS — Z3A32 32 weeks gestation of pregnancy: Secondary | ICD-10-CM

## 2018-12-07 NOTE — Telephone Encounter (Signed)
Received phone call from [employer] due to elevated blood pressure of 133/90. Per chart review, patient was seen in MFM today and has a hx of chronic HTN- no intervention needed at this time per Dr Juleen China.

## 2018-12-10 ENCOUNTER — Telehealth: Payer: Self-pay | Admitting: General Practice

## 2018-12-10 NOTE — Telephone Encounter (Signed)
Received alert from [employer] patient had elevated BP 143/94 with no headaches, dizziness, blurry vision, or abdominal pain. Per chart review, patient has hx of chronic HTN no meds- next OB visit is 8/5. Per Dr Harolyn Rutherford, no intervention needed at this time.

## 2018-12-13 ENCOUNTER — Other Ambulatory Visit: Payer: Self-pay | Admitting: Obstetrics & Gynecology

## 2018-12-13 ENCOUNTER — Encounter: Payer: Self-pay | Admitting: *Deleted

## 2018-12-13 NOTE — Progress Notes (Signed)
[employer] reports 150/107 BP. Message sent to Clinical pool to contact patient tomorrow and determine if she needs to be seen before her appointment 12/15/2018

## 2018-12-14 ENCOUNTER — Encounter (HOSPITAL_COMMUNITY): Payer: Self-pay

## 2018-12-14 ENCOUNTER — Inpatient Hospital Stay (HOSPITAL_COMMUNITY)
Admission: AD | Admit: 2018-12-14 | Discharge: 2018-12-18 | DRG: 787 | Disposition: A | Discharge status: Home / Self Care | Payer: Medicaid Other | Attending: Obstetrics and Gynecology | Admitting: Obstetrics and Gynecology

## 2018-12-14 ENCOUNTER — Other Ambulatory Visit: Payer: Self-pay

## 2018-12-14 DIAGNOSIS — Z3A33 33 weeks gestation of pregnancy: Secondary | ICD-10-CM

## 2018-12-14 DIAGNOSIS — D649 Anemia, unspecified: Secondary | ICD-10-CM | POA: Diagnosis present

## 2018-12-14 DIAGNOSIS — E876 Hypokalemia: Secondary | ICD-10-CM | POA: Diagnosis present

## 2018-12-14 DIAGNOSIS — O1410 Severe pre-eclampsia, unspecified trimester: Secondary | ICD-10-CM | POA: Diagnosis present

## 2018-12-14 DIAGNOSIS — Z8759 Personal history of other complications of pregnancy, childbirth and the puerperium: Secondary | ICD-10-CM | POA: Diagnosis present

## 2018-12-14 DIAGNOSIS — O9902 Anemia complicating childbirth: Secondary | ICD-10-CM | POA: Diagnosis present

## 2018-12-14 DIAGNOSIS — Z20828 Contact with and (suspected) exposure to other viral communicable diseases: Secondary | ICD-10-CM | POA: Diagnosis present

## 2018-12-14 DIAGNOSIS — O114 Pre-existing hypertension with pre-eclampsia, complicating childbirth: Principal | ICD-10-CM | POA: Diagnosis present

## 2018-12-14 DIAGNOSIS — O26899 Other specified pregnancy related conditions, unspecified trimester: Secondary | ICD-10-CM | POA: Diagnosis present

## 2018-12-14 DIAGNOSIS — O9912 Other diseases of the blood and blood-forming organs and certain disorders involving the immune mechanism complicating childbirth: Secondary | ICD-10-CM | POA: Diagnosis present

## 2018-12-14 DIAGNOSIS — D696 Thrombocytopenia, unspecified: Secondary | ICD-10-CM | POA: Diagnosis present

## 2018-12-14 DIAGNOSIS — O99119 Other diseases of the blood and blood-forming organs and certain disorders involving the immune mechanism complicating pregnancy, unspecified trimester: Secondary | ICD-10-CM | POA: Diagnosis present

## 2018-12-14 DIAGNOSIS — F17210 Nicotine dependence, cigarettes, uncomplicated: Secondary | ICD-10-CM | POA: Diagnosis present

## 2018-12-14 DIAGNOSIS — D6959 Other secondary thrombocytopenia: Secondary | ICD-10-CM | POA: Diagnosis present

## 2018-12-14 DIAGNOSIS — O1002 Pre-existing essential hypertension complicating childbirth: Secondary | ICD-10-CM

## 2018-12-14 DIAGNOSIS — O99334 Smoking (tobacco) complicating childbirth: Secondary | ICD-10-CM | POA: Diagnosis present

## 2018-12-14 DIAGNOSIS — O1490 Unspecified pre-eclampsia, unspecified trimester: Secondary | ICD-10-CM | POA: Diagnosis present

## 2018-12-14 LAB — COMPREHENSIVE METABOLIC PANEL
ALT: 8 U/L (ref 0–44)
AST: 11 U/L — ABNORMAL LOW (ref 15–41)
Albumin: 2.8 g/dL — ABNORMAL LOW (ref 3.5–5.0)
Alkaline Phosphatase: 61 U/L (ref 38–126)
Anion gap: 9 (ref 5–15)
BUN: <5 mg/dL — ABNORMAL LOW (ref 6–20)
CO2: 23 mmol/L (ref 22–32)
Calcium: 8.3 mg/dL — ABNORMAL LOW (ref 8.9–10.3)
Chloride: 105 mmol/L (ref 98–111)
Creatinine, Ser: 0.61 mg/dL (ref 0.44–1.00)
GFR calc Af Amer: >60 mL/min (ref >60)
GFR calc non Af Amer: >60 mL/min (ref >60)
Glucose, Bld: 81 mg/dL (ref 70–99)
Potassium: 2.9 mmol/L — ABNORMAL LOW (ref 3.5–5.1)
Sodium: 137 mmol/L (ref 135–145)
Total Bilirubin: 0.3 mg/dL (ref 0.3–1.2)
Total Protein: 6 g/dL — ABNORMAL LOW (ref 6.5–8.1)

## 2018-12-14 LAB — CBC
HCT: 29.5 % — ABNORMAL LOW (ref 36.0–46.0)
HCT: 30.7 % — ABNORMAL LOW (ref 36.0–46.0)
Hemoglobin: 9.9 g/dL — ABNORMAL LOW (ref 12.0–15.0)
Hemoglobin: 10.2 g/dL — ABNORMAL LOW (ref 12.0–15.0)
MCH: 29.5 pg (ref 26.0–34.0)
MCH: 29.5 pg (ref 26.0–34.0)
MCHC: 33.6 g/dL (ref 30.0–36.0)
MCHC: 33.2 g/dL (ref 30.0–36.0)
MCV: 87.8 fL (ref 80.0–100.0)
MCV: 88.7 fL (ref 80.0–100.0)
Platelets: 132 10*3/uL — ABNORMAL LOW (ref 150–400)
Platelets: 140 10*3/uL — ABNORMAL LOW (ref 150–400)
RBC: 3.36 MIL/uL — ABNORMAL LOW (ref 3.87–5.11)
RBC: 3.46 MIL/uL — ABNORMAL LOW (ref 3.87–5.11)
RDW: 16.2 % — ABNORMAL HIGH (ref 11.5–15.5)
RDW: 16.3 % — ABNORMAL HIGH (ref 11.5–15.5)
WBC: 9.2 10*3/uL (ref 4.0–10.5)
WBC: 9.3 10*3/uL (ref 4.0–10.5)
nRBC: 0 % (ref 0.0–0.2)
nRBC: 0 % (ref 0.0–0.2)

## 2018-12-14 LAB — PROTEIN / CREATININE RATIO, URINE
Creatinine, Urine: 237.62 mg/dL
Protein Creatinine Ratio: 0.17 mg/mg{Cre} — ABNORMAL HIGH (ref 0.00–0.15)
Total Protein, Urine: 41 mg/dL

## 2018-12-14 LAB — TYPE AND SCREEN
ABO/RH(D): B POS
Antibody Screen: NEGATIVE

## 2018-12-14 MED ORDER — SODIUM CHLORIDE 0.9% FLUSH: 3.0000 mL | Freq: Two times a day (BID) | INTRAVENOUS | Status: DC

## 2018-12-14 MED ORDER — ENOXAPARIN SODIUM 40 MG/0.4ML ~~LOC~~ SOLN
40.0000 mg | SUBCUTANEOUS | Status: DC
  Administered 2018-12-14: 40 mg via SUBCUTANEOUS
  Filled 2018-12-14: qty 0.4

## 2018-12-14 MED ORDER — MAGNESIUM SULFATE BOLUS VIA INFUSION
4.0000 g | Freq: Once | INTRAVENOUS | Status: AC
  Administered 2018-12-14: 4 g via INTRAVENOUS
  Filled 2018-12-14: qty 500

## 2018-12-14 MED ORDER — DOCUSATE SODIUM 100 MG PO CAPS
100.0000 mg | ORAL_CAPSULE | Freq: Every day | ORAL | Status: DC
  Administered 2018-12-15: 100 mg via ORAL
  Filled 2018-12-14: qty 1

## 2018-12-14 MED ORDER — FERROUS SULFATE 325 (65 FE) MG PO TABS
325.0000 mg | ORAL_TABLET | Freq: Two times a day (BID) | ORAL | Status: DC
  Administered 2018-12-14: 325 mg via ORAL
  Filled 2018-12-14: qty 1

## 2018-12-14 MED ORDER — BUTALBITAL-APAP-CAFFEINE 50-325-40 MG PO TABS
2.0000 | ORAL_TABLET | Freq: Once | ORAL | Status: AC
  Administered 2018-12-14: 18:00:00 2 via ORAL
  Filled 2018-12-14: qty 2

## 2018-12-14 MED ORDER — ZOLPIDEM TARTRATE 5 MG PO TABS: 5.0000 mg | ORAL_TABLET | Freq: Every evening | ORAL | Status: DC | PRN

## 2018-12-14 MED ORDER — SODIUM CHLORIDE 0.9 % IV SOLN: 250.0000 mL | INTRAVENOUS | Status: DC | PRN

## 2018-12-14 MED ORDER — PRENATAL MULTIVITAMIN CH
1.0000 | ORAL_TABLET | Freq: Every day | ORAL | Status: DC
  Administered 2018-12-15: 1 via ORAL
  Filled 2018-12-14: qty 1

## 2018-12-14 MED ORDER — SODIUM CHLORIDE 0.9% FLUSH: 3.0000 mL | INTRAVENOUS | Status: DC | PRN

## 2018-12-14 MED ORDER — CALCIUM CARBONATE ANTACID 500 MG PO CHEW: 2.0000 | CHEWABLE_TABLET | ORAL | Status: DC | PRN

## 2018-12-14 MED ORDER — BETAMETHASONE SOD PHOS & ACET 6 (3-3) MG/ML IJ SUSP
12.0000 mg | INTRAMUSCULAR | Status: DC
  Administered 2018-12-14: 12 mg via INTRAMUSCULAR
  Filled 2018-12-14 (×2): qty 2

## 2018-12-14 MED ORDER — ACETAMINOPHEN 325 MG PO TABS
650.0000 mg | ORAL_TABLET | ORAL | Status: DC | PRN
  Administered 2018-12-15 (×2): 650 mg via ORAL
  Filled 2018-12-14 (×2): qty 2

## 2018-12-14 MED ORDER — ASPIRIN EC 81 MG PO TBEC
81.0000 mg | DELAYED_RELEASE_TABLET | Freq: Every day | ORAL | Status: DC
  Administered 2018-12-15: 81 mg via ORAL
  Filled 2018-12-14: qty 1

## 2018-12-14 MED ORDER — LACTATED RINGERS IV SOLN
INTRAVENOUS | Status: DC
  Administered 2018-12-14 – 2018-12-15 (×2): via INTRAVENOUS

## 2018-12-14 MED ORDER — MAGNESIUM SULFATE 40 G IN LACTATED RINGERS - SIMPLE
2.0000 g/h | INTRAVENOUS | Status: DC
  Administered 2018-12-14 – 2018-12-15 (×2): 2 g/h via INTRAVENOUS
  Filled 2018-12-14 (×2): qty 500

## 2018-12-14 NOTE — MAU Note (Signed)
Pt states she has had a headache for 3-4 days. Pt states that she has only taken tylenol 500 mg for the headache states that it does help the headache some until it wears off.   Last night pt states she felt dizzy. Pt states that she has been taken her BP at home the last couple of day and all have been high.   Pt states a nurse from her [employer] called her and told her to come in to be evaluated.   Reports +FM   Denies vaginal bleeding or LOF.

## 2018-12-14 NOTE — H&P (Signed)
Toni Maynard is a 33 y.o. female being admitted for chronic hypertension with superimposed preeclampsia. Hx of gestational hypertension in last 2 pregnancies & recently diagnosed with chronic hypertension. Is not taking medication for her blood pressure. Had 8 SVDs, 7 were term & the last was at 36 wks after IOL for GHTN.  Presented to MAU this evening for headache. Reports constant headache x 4 days. Wasn't able to come for evaluation prior to today d/t childcare issues. Reports headache she rates 7/10. Has been taking 1 ES tylenol daily. Last dose was last night. States tylenol helps somewhat with headache but then pain gets worse when it wears off. Also reports some blurred vision today. Denies epigastric pain. PEC labs done in MAU. Platelets have dropped to 135. Liver function, serum creatinine, and urine PCR are normal. Headache with mild improvement (7>4) after 2 fioricet. No severe range BPs.   OB History    Gravida  9   Para  8   Term  7   Preterm  1   AB  0   Living  8     SAB  0   TAB  0   Ectopic  0   Multiple  0   Live Births  8          Past Medical History:  Diagnosis Date  . Hypertension    2017 and 2019 pregnancies  . Preterm labor    Induced for ghtn   Past Surgical History:  Procedure Laterality Date  . NO PAST SURGERIES     Family History: family history is not on file. Social History:  reports that she has been smoking. She has been smoking about 0.25 packs per day. She has never used smokeless tobacco. She reports previous alcohol use. She reports previous drug use.     Maternal Diabetes: No Genetic Screening: Abnormal:  Results: Elevated AFP Maternal Ultrasounds/Referrals: Normal Fetal Ultrasounds or other Referrals:  None Maternal Substance Abuse:  No Significant Maternal Medications:  None Significant Maternal Lab Results:  None Other Comments:  None  Review of Systems  Constitutional: Negative for chills and fever.  Eyes:  Positive for blurred vision. Negative for double vision and photophobia.  Neurological: Positive for headaches.   History Dilation: 1 Effacement (%): Thick Station: -3 Exam by:: Judeth HornErin Dorna Mallet NP  Blood pressure (!) 147/93, pulse 65, temperature 98.4 F (36.9 C), resp. rate 12, last menstrual period 06/29/2018, SpO2 100 %. Maternal Exam:  Uterine Assessment: Contraction strength is mild.  Contraction frequency is irregular.   Abdomen: Patient reports no abdominal tenderness. Cervix: Cervix evaluated by digital exam.     Fetal Exam Fetal Monitor Review: Baseline rate: 140.  Variability: moderate (6-25 bpm).   Pattern: accelerations present and no decelerations.    Fetal State Assessment: Category I - tracings are normal.     Physical Exam  Nursing note and vitals reviewed. Constitutional: She is oriented to person, place, and time. She appears well-developed and well-nourished. No distress.  HENT:  Head: Normocephalic and atraumatic.  Cardiovascular: Normal rate, regular rhythm and normal heart sounds.  Respiratory: Effort normal and breath sounds normal. No respiratory distress. She has no wheezes.  GI: Soft. There is no abdominal tenderness.  Genitourinary:    Genitourinary Comments: Dilation: 1 Effacement (%): Thick Cervical Position: Posterior Station: -3 Exam by:: Judeth HornErin Alyannah Sanks NP     Musculoskeletal: Normal range of motion.        General: No edema.  Neurological: She is alert  and oriented to person, place, and time. She displays normal reflexes.  Skin: Skin is warm and dry. She is not diaphoretic.  Psychiatric: She has a normal mood and affect. Her behavior is normal. Judgment and thought content normal.    Prenatal labs: ABO, Rh: B/Positive/-- (06/04 0000) Antibody: Negative (06/04 0000) Rubella: Immune (06/04 0000) RPR: Non Reactive (07/16 0933)  HBsAg: Negative (06/04 0000)  HIV: Non Reactive (07/16 0933)  GBS:   not done   Results for orders placed or  performed during the hospital encounter of 12/14/18 (from the past 24 hour(s))  CBC     Status: Abnormal   Collection Time: 12/14/18  5:12 PM  Result Value Ref Range   WBC 9.2 4.0 - 10.5 K/uL   RBC 3.36 (L) 3.87 - 5.11 MIL/uL   Hemoglobin 9.9 (L) 12.0 - 15.0 g/dL   HCT 29.5 (L) 36.0 - 46.0 %   MCV 87.8 80.0 - 100.0 fL   MCH 29.5 26.0 - 34.0 pg   MCHC 33.6 30.0 - 36.0 g/dL   RDW 16.2 (H) 11.5 - 15.5 %   Platelets 132 (L) 150 - 400 K/uL   nRBC 0.0 0.0 - 0.2 %  Comprehensive metabolic panel     Status: Abnormal   Collection Time: 12/14/18  5:12 PM  Result Value Ref Range   Sodium 137 135 - 145 mmol/L   Potassium 2.9 (L) 3.5 - 5.1 mmol/L   Chloride 105 98 - 111 mmol/L   CO2 23 22 - 32 mmol/L   Glucose, Bld 81 70 - 99 mg/dL   BUN <5 (L) 6 - 20 mg/dL   Creatinine, Ser 0.61 0.44 - 1.00 mg/dL   Calcium 8.3 (L) 8.9 - 10.3 mg/dL   Total Protein 6.0 (L) 6.5 - 8.1 g/dL   Albumin 2.8 (L) 3.5 - 5.0 g/dL   AST 11 (L) 15 - 41 U/L   ALT 8 0 - 44 U/L   Alkaline Phosphatase 61 38 - 126 U/L   Total Bilirubin 0.3 0.3 - 1.2 mg/dL   GFR calc non Af Amer >60 >60 mL/min   GFR calc Af Amer >60 >60 mL/min   Anion gap 9 5 - 15  Protein / creatinine ratio, urine     Status: Abnormal   Collection Time: 12/14/18  5:15 PM  Result Value Ref Range   Creatinine, Urine 237.62 mg/dL   Total Protein, Urine 41 mg/dL   Protein Creatinine Ratio 0.17 (H) 0.00 - 0.15 mg/mg[Cre]    Assessment/Plan: 1. Pre-eclampsia affecting pregnancy, antepartum   -admit to [facility] per consult with Dr. Lamar Benes 12/14/2018, 7:51 PM

## 2018-12-15 ENCOUNTER — Observation Stay (HOSPITAL_COMMUNITY): Payer: Medicaid Other | Admitting: Anesthesiology

## 2018-12-15 ENCOUNTER — Encounter (HOSPITAL_COMMUNITY)
Admission: AD | Disposition: A | Discharge status: Home / Self Care | Payer: Self-pay | Source: Home / Self Care | Attending: Obstetrics and Gynecology

## 2018-12-15 ENCOUNTER — Encounter (HOSPITAL_COMMUNITY): Payer: Self-pay | Admitting: *Deleted

## 2018-12-15 ENCOUNTER — Telehealth: Payer: Medicaid Other | Admitting: Obstetrics & Gynecology

## 2018-12-15 ENCOUNTER — Observation Stay (HOSPITAL_COMMUNITY): Payer: Medicaid Other

## 2018-12-15 DIAGNOSIS — O0933 Supervision of pregnancy with insufficient antenatal care, third trimester: Secondary | ICD-10-CM

## 2018-12-15 DIAGNOSIS — O9902 Anemia complicating childbirth: Secondary | ICD-10-CM | POA: Diagnosis present

## 2018-12-15 DIAGNOSIS — O1002 Pre-existing essential hypertension complicating childbirth: Secondary | ICD-10-CM | POA: Diagnosis present

## 2018-12-15 DIAGNOSIS — D696 Thrombocytopenia, unspecified: Secondary | ICD-10-CM | POA: Diagnosis present

## 2018-12-15 DIAGNOSIS — Z3A33 33 weeks gestation of pregnancy: Secondary | ICD-10-CM | POA: Diagnosis not present

## 2018-12-15 DIAGNOSIS — O0943 Supervision of pregnancy with grand multiparity, third trimester: Secondary | ICD-10-CM | POA: Diagnosis not present

## 2018-12-15 DIAGNOSIS — O9912 Other diseases of the blood and blood-forming organs and certain disorders involving the immune mechanism complicating childbirth: Secondary | ICD-10-CM | POA: Diagnosis present

## 2018-12-15 DIAGNOSIS — E876 Hypokalemia: Secondary | ICD-10-CM | POA: Diagnosis present

## 2018-12-15 DIAGNOSIS — O10013 Pre-existing essential hypertension complicating pregnancy, third trimester: Secondary | ICD-10-CM

## 2018-12-15 DIAGNOSIS — D6959 Other secondary thrombocytopenia: Secondary | ICD-10-CM | POA: Diagnosis present

## 2018-12-15 DIAGNOSIS — O09213 Supervision of pregnancy with history of pre-term labor, third trimester: Secondary | ICD-10-CM

## 2018-12-15 DIAGNOSIS — D649 Anemia, unspecified: Secondary | ICD-10-CM | POA: Diagnosis present

## 2018-12-15 DIAGNOSIS — O09293 Supervision of pregnancy with other poor reproductive or obstetric history, third trimester: Secondary | ICD-10-CM

## 2018-12-15 DIAGNOSIS — O114 Pre-existing hypertension with pre-eclampsia, complicating childbirth: Secondary | ICD-10-CM | POA: Diagnosis present

## 2018-12-15 DIAGNOSIS — O09523 Supervision of elderly multigravida, third trimester: Secondary | ICD-10-CM | POA: Diagnosis not present

## 2018-12-15 DIAGNOSIS — O99333 Smoking (tobacco) complicating pregnancy, third trimester: Secondary | ICD-10-CM

## 2018-12-15 DIAGNOSIS — O26899 Other specified pregnancy related conditions, unspecified trimester: Secondary | ICD-10-CM | POA: Diagnosis present

## 2018-12-15 DIAGNOSIS — Z20828 Contact with and (suspected) exposure to other viral communicable diseases: Secondary | ICD-10-CM | POA: Diagnosis present

## 2018-12-15 DIAGNOSIS — O99334 Smoking (tobacco) complicating childbirth: Secondary | ICD-10-CM | POA: Diagnosis present

## 2018-12-15 DIAGNOSIS — F17210 Nicotine dependence, cigarettes, uncomplicated: Secondary | ICD-10-CM | POA: Diagnosis present

## 2018-12-15 LAB — COMPREHENSIVE METABOLIC PANEL
ALT: 8 U/L (ref 0–44)
AST: 17 U/L (ref 15–41)
Albumin: 2.9 g/dL — ABNORMAL LOW (ref 3.5–5.0)
Alkaline Phosphatase: 67 U/L (ref 38–126)
Anion gap: 12 (ref 5–15)
BUN: <5 mg/dL — ABNORMAL LOW (ref 6–20)
CO2: 19 mmol/L — ABNORMAL LOW (ref 22–32)
Calcium: 7.5 mg/dL — ABNORMAL LOW (ref 8.9–10.3)
Chloride: 107 mmol/L (ref 98–111)
Creatinine, Ser: 0.67 mg/dL (ref 0.44–1.00)
GFR calc Af Amer: >60 mL/min (ref >60)
GFR calc non Af Amer: >60 mL/min (ref >60)
Glucose, Bld: 138 mg/dL — ABNORMAL HIGH (ref 70–99)
Potassium: 3.3 mmol/L — ABNORMAL LOW (ref 3.5–5.1)
Sodium: 138 mmol/L (ref 135–145)
Total Bilirubin: 0.5 mg/dL (ref 0.3–1.2)
Total Protein: 6.5 g/dL (ref 6.5–8.1)

## 2018-12-15 LAB — CBC
HCT: 32.6 % — ABNORMAL LOW (ref 36.0–46.0)
Hemoglobin: 10.9 g/dL — ABNORMAL LOW (ref 12.0–15.0)
MCH: 29.9 pg (ref 26.0–34.0)
MCHC: 33.4 g/dL (ref 30.0–36.0)
MCV: 89.3 fL (ref 80.0–100.0)
Platelets: 155 10*3/uL (ref 150–400)
RBC: 3.65 MIL/uL — ABNORMAL LOW (ref 3.87–5.11)
RDW: 16.1 % — ABNORMAL HIGH (ref 11.5–15.5)
WBC: 11.1 10*3/uL — ABNORMAL HIGH (ref 4.0–10.5)
nRBC: 0 % (ref 0.0–0.2)

## 2018-12-15 LAB — ABO/RH: ABO/RH(D): B POS

## 2018-12-15 LAB — FERRITIN: Ferritin: 129 ng/mL (ref 11–307)

## 2018-12-15 LAB — SARS CORONAVIRUS 2 BY RT PCR (HOSPITAL ORDER, PERFORMED IN ~~LOC~~ HOSPITAL LAB): SARS Coronavirus 2: NEGATIVE

## 2018-12-15 SURGERY — Surgical Case
Anesthesia: Spinal | Site: Abdomen | Wound class: Clean Contaminated

## 2018-12-15 MED ORDER — NALBUPHINE HCL 10 MG/ML IJ SOLN: 5.0000 mg | Freq: Once | INTRAMUSCULAR | Status: DC | PRN

## 2018-12-15 MED ORDER — METOCLOPRAMIDE HCL 5 MG/ML IJ SOLN
INTRAMUSCULAR | Status: AC
  Filled 2018-12-15: qty 2

## 2018-12-15 MED ORDER — MORPHINE SULFATE (PF) 0.5 MG/ML IJ SOLN
INTRAMUSCULAR | Status: AC
  Filled 2018-12-15: qty 10

## 2018-12-15 MED ORDER — NALBUPHINE HCL 10 MG/ML IJ SOLN
5.0000 mg | INTRAMUSCULAR | Status: DC | PRN
  Administered 2018-12-16: 5 mg via SUBCUTANEOUS
  Filled 2018-12-15: qty 1

## 2018-12-15 MED ORDER — OXYCODONE HCL 5 MG PO TABS
10.0000 mg | ORAL_TABLET | Freq: Once | ORAL | Status: AC
  Administered 2018-12-15: 10 mg via ORAL
  Filled 2018-12-15: qty 2

## 2018-12-15 MED ORDER — ACETAMINOPHEN 500 MG PO TABS: 1000.0000 mg | ORAL_TABLET | Freq: Four times a day (QID) | ORAL | Status: DC

## 2018-12-15 MED ORDER — NALOXONE HCL 0.4 MG/ML IJ SOLN: 0.4000 mg | INTRAMUSCULAR | Status: DC | PRN

## 2018-12-15 MED ORDER — SODIUM CHLORIDE 0.9% FLUSH: 3.0000 mL | INTRAVENOUS | Status: DC | PRN

## 2018-12-15 MED ORDER — SOD CITRATE-CITRIC ACID 500-334 MG/5ML PO SOLN
ORAL | Status: AC
  Filled 2018-12-15: qty 15

## 2018-12-15 MED ORDER — ACETAMINOPHEN 500 MG PO TABS
1000.0000 mg | ORAL_TABLET | Freq: Four times a day (QID) | ORAL | Status: AC
  Administered 2018-12-15 – 2018-12-16 (×4): 1000 mg via ORAL
  Filled 2018-12-15 (×4): qty 2

## 2018-12-15 MED ORDER — COCONUT OIL OIL: 1.0000 "application " | TOPICAL_OIL | Status: DC | PRN

## 2018-12-15 MED ORDER — MEPERIDINE HCL 25 MG/ML IJ SOLN: 6.2500 mg | INTRAMUSCULAR | Status: DC | PRN

## 2018-12-15 MED ORDER — SODIUM CHLORIDE 0.9 % IV SOLN
INTRAVENOUS | Status: DC | PRN
  Administered 2018-12-15: 13:00:00 via INTRAVENOUS

## 2018-12-15 MED ORDER — OXYTOCIN 40 UNITS IN NORMAL SALINE INFUSION - SIMPLE MED
INTRAVENOUS | Status: AC
  Filled 2018-12-15: qty 1000

## 2018-12-15 MED ORDER — DEXAMETHASONE SODIUM PHOSPHATE 4 MG/ML IJ SOLN
INTRAMUSCULAR | Status: DC | PRN
  Administered 2018-12-15: 4 mg via INTRAVENOUS

## 2018-12-15 MED ORDER — SOD CITRATE-CITRIC ACID 500-334 MG/5ML PO SOLN: 30.0000 mL | ORAL | Status: DC

## 2018-12-15 MED ORDER — SIMETHICONE 80 MG PO CHEW
80.0000 mg | CHEWABLE_TABLET | Freq: Three times a day (TID) | ORAL | Status: DC
  Administered 2018-12-15 – 2018-12-18 (×8): 80 mg via ORAL
  Filled 2018-12-15 (×8): qty 1

## 2018-12-15 MED ORDER — POTASSIUM CHLORIDE CRYS ER 20 MEQ PO TBCR
40.0000 meq | EXTENDED_RELEASE_TABLET | Freq: Two times a day (BID) | ORAL | Status: AC
  Administered 2018-12-15 – 2018-12-16 (×4): 40 meq via ORAL
  Filled 2018-12-15 (×4): qty 2

## 2018-12-15 MED ORDER — PHENYLEPHRINE HCL-NACL 20-0.9 MG/250ML-% IV SOLN
INTRAVENOUS | Status: AC
  Filled 2018-12-15: qty 250

## 2018-12-15 MED ORDER — LACTATED RINGERS IV BOLUS
1000.0000 mL | Freq: Once | INTRAVENOUS | Status: AC
  Administered 2018-12-15: 09:00:00 1000 mL via INTRAVENOUS

## 2018-12-15 MED ORDER — SODIUM CHLORIDE 0.9 % IR SOLN
Status: DC | PRN
  Administered 2018-12-15: 1

## 2018-12-15 MED ORDER — SCOPOLAMINE 1 MG/3DAYS TD PT72: 1.0000 | MEDICATED_PATCH | Freq: Once | TRANSDERMAL | Status: DC

## 2018-12-15 MED ORDER — MEDROXYPROGESTERONE ACETATE 150 MG/ML IM SUSP
150.0000 mg | INTRAMUSCULAR | Status: AC | PRN
  Administered 2018-12-16: 150 mg via INTRAMUSCULAR
  Filled 2018-12-15: qty 1

## 2018-12-15 MED ORDER — OXYCODONE HCL 5 MG PO TABS
5.0000 mg | ORAL_TABLET | ORAL | Status: DC | PRN
  Administered 2018-12-15: 5 mg via ORAL
  Administered 2018-12-16 – 2018-12-17 (×6): 10 mg via ORAL
  Filled 2018-12-15: qty 1
  Filled 2018-12-15 (×6): qty 2

## 2018-12-15 MED ORDER — ACETAMINOPHEN 10 MG/ML IV SOLN
INTRAVENOUS | Status: AC
  Filled 2018-12-15: qty 100

## 2018-12-15 MED ORDER — OXYTOCIN 40 UNITS IN NORMAL SALINE INFUSION - SIMPLE MED
2.5000 [IU]/h | INTRAVENOUS | Status: AC
  Administered 2018-12-15: 2.5 [IU]/h via INTRAVENOUS
  Filled 2018-12-15: qty 1000

## 2018-12-15 MED ORDER — LACTATED RINGERS IV SOLN
INTRAVENOUS | Status: DC | PRN
  Administered 2018-12-15: 12:00:00 via INTRAVENOUS

## 2018-12-15 MED ORDER — ONDANSETRON HCL 4 MG/2ML IJ SOLN
INTRAMUSCULAR | Status: AC
  Filled 2018-12-15: qty 2

## 2018-12-15 MED ORDER — MORPHINE SULFATE (PF) 0.5 MG/ML IJ SOLN
INTRAMUSCULAR | Status: DC | PRN
  Administered 2018-12-15: .15 ug via INTRATHECAL

## 2018-12-15 MED ORDER — MENTHOL 3 MG MT LOZG: 1.0000 | LOZENGE | OROMUCOSAL | Status: DC | PRN

## 2018-12-15 MED ORDER — DIPHENHYDRAMINE HCL 25 MG PO CAPS: 25.0000 mg | ORAL_CAPSULE | Freq: Four times a day (QID) | ORAL | Status: DC | PRN

## 2018-12-15 MED ORDER — DIBUCAINE (PERIANAL) 1 % EX OINT: 1.0000 "application " | TOPICAL_OINTMENT | CUTANEOUS | Status: DC | PRN

## 2018-12-15 MED ORDER — FENTANYL CITRATE (PF) 100 MCG/2ML IJ SOLN
INTRAMUSCULAR | Status: DC | PRN
  Administered 2018-12-15: 15 ug via INTRATHECAL

## 2018-12-15 MED ORDER — POLYETHYLENE GLYCOL 3350 17 G PO PACK
17.0000 g | PACK | Freq: Every day | ORAL | Status: DC
  Administered 2018-12-17 – 2018-12-18 (×2): 17 g via ORAL
  Filled 2018-12-15 (×4): qty 1

## 2018-12-15 MED ORDER — DIPHENHYDRAMINE HCL 25 MG PO CAPS
25.0000 mg | ORAL_CAPSULE | ORAL | Status: DC | PRN
  Administered 2018-12-16: 25 mg via ORAL
  Filled 2018-12-15: qty 1

## 2018-12-15 MED ORDER — LACTATED RINGERS IV SOLN
INTRAVENOUS | Status: DC
  Administered 2018-12-15: 10:00:00 via INTRAVENOUS

## 2018-12-15 MED ORDER — METOCLOPRAMIDE HCL 5 MG/ML IJ SOLN
10.0000 mg | Freq: Once | INTRAMUSCULAR | Status: AC
  Administered 2018-12-15: 10 mg via INTRAVENOUS
  Filled 2018-12-15: qty 2

## 2018-12-15 MED ORDER — NALOXONE HCL 4 MG/10ML IJ SOLN
1.0000 ug/kg/h | INTRAVENOUS | Status: DC | PRN
  Filled 2018-12-15: qty 5

## 2018-12-15 MED ORDER — NALBUPHINE HCL 10 MG/ML IJ SOLN
5.0000 mg | INTRAMUSCULAR | Status: DC | PRN
  Administered 2018-12-15: 5 mg via INTRAVENOUS
  Filled 2018-12-15: qty 1

## 2018-12-15 MED ORDER — SODIUM CHLORIDE 0.9 % IV SOLN
INTRAVENOUS | Status: DC | PRN
  Administered 2018-12-15: 30 ug/min via INTRAVENOUS

## 2018-12-15 MED ORDER — PRENATAL MULTIVITAMIN CH
1.0000 | ORAL_TABLET | Freq: Every day | ORAL | Status: DC
  Administered 2018-12-16 – 2018-12-17 (×2): 1 via ORAL
  Filled 2018-12-15 (×2): qty 1

## 2018-12-15 MED ORDER — BUPIVACAINE IN DEXTROSE 0.75-8.25 % IT SOLN
INTRATHECAL | Status: DC | PRN
  Administered 2018-12-15: 1.4 mL via INTRATHECAL

## 2018-12-15 MED ORDER — CEFAZOLIN SODIUM-DEXTROSE 2-3 GM-%(50ML) IV SOLR
INTRAVENOUS | Status: DC | PRN
  Administered 2018-12-15: 2 g via INTRAVENOUS

## 2018-12-15 MED ORDER — TRANEXAMIC ACID-NACL 1000-0.7 MG/100ML-% IV SOLN
1000.0000 mg | INTRAVENOUS | Status: AC
  Administered 2018-12-15: 1000 mg via INTRAVENOUS

## 2018-12-15 MED ORDER — DEXAMETHASONE SODIUM PHOSPHATE 4 MG/ML IJ SOLN
INTRAMUSCULAR | Status: AC
  Filled 2018-12-15: qty 7

## 2018-12-15 MED ORDER — DIPHENHYDRAMINE HCL 50 MG/ML IJ SOLN
12.5000 mg | INTRAMUSCULAR | Status: DC | PRN
  Administered 2018-12-15: 12.5 mg via INTRAVENOUS

## 2018-12-15 MED ORDER — TETANUS-DIPHTH-ACELL PERTUSSIS 5-2.5-18.5 LF-MCG/0.5 IM SUSP
0.5000 mL | Freq: Once | INTRAMUSCULAR | Status: AC
  Administered 2018-12-16: 0.5 mL via INTRAMUSCULAR
  Filled 2018-12-15: qty 0.5

## 2018-12-15 MED ORDER — FAMOTIDINE 20 MG PO TABS: 20.0000 mg | ORAL_TABLET | Freq: Every day | ORAL | Status: DC

## 2018-12-15 MED ORDER — SIMETHICONE 80 MG PO CHEW: 80.0000 mg | CHEWABLE_TABLET | ORAL | Status: DC

## 2018-12-15 MED ORDER — SIMETHICONE 80 MG PO CHEW: 80.0000 mg | CHEWABLE_TABLET | ORAL | Status: DC | PRN

## 2018-12-15 MED ORDER — HYDROMORPHONE HCL 1 MG/ML IJ SOLN: 0.2500 mg | INTRAMUSCULAR | Status: DC | PRN

## 2018-12-15 MED ORDER — ZOLPIDEM TARTRATE 5 MG PO TABS: 5.0000 mg | ORAL_TABLET | Freq: Every evening | ORAL | Status: DC | PRN

## 2018-12-15 MED ORDER — MAGNESIUM SULFATE 40 G IN LACTATED RINGERS - SIMPLE
2.0000 g/h | INTRAVENOUS | Status: AC
  Administered 2018-12-16: 2 g/h via INTRAVENOUS
  Filled 2018-12-15: qty 500

## 2018-12-15 MED ORDER — TRANEXAMIC ACID-NACL 1000-0.7 MG/100ML-% IV SOLN
INTRAVENOUS | Status: AC
  Filled 2018-12-15: qty 100

## 2018-12-15 MED ORDER — STERILE WATER FOR IRRIGATION IR SOLN
Status: DC | PRN
  Administered 2018-12-15: 1

## 2018-12-15 MED ORDER — ENOXAPARIN SODIUM 40 MG/0.4ML ~~LOC~~ SOLN
40.0000 mg | SUBCUTANEOUS | Status: DC
  Administered 2018-12-16 – 2018-12-18 (×3): 40 mg via SUBCUTANEOUS
  Filled 2018-12-15 (×3): qty 0.4

## 2018-12-15 MED ORDER — DIPHENHYDRAMINE HCL 50 MG/ML IJ SOLN
INTRAMUSCULAR | Status: AC
  Filled 2018-12-15: qty 1

## 2018-12-15 MED ORDER — FENTANYL CITRATE (PF) 100 MCG/2ML IJ SOLN
INTRAMUSCULAR | Status: AC
  Filled 2018-12-15: qty 2

## 2018-12-15 MED ORDER — ONDANSETRON HCL 4 MG/2ML IJ SOLN
INTRAMUSCULAR | Status: DC | PRN
  Administered 2018-12-15: 4 mg via INTRAVENOUS

## 2018-12-15 MED ORDER — CEFAZOLIN SODIUM-DEXTROSE 2-4 GM/100ML-% IV SOLN
2.0000 g | INTRAVENOUS | Status: DC
  Filled 2018-12-15: qty 100

## 2018-12-15 MED ORDER — CEFAZOLIN SODIUM-DEXTROSE 2-4 GM/100ML-% IV SOLN: 2.0000 g | INTRAVENOUS | Status: DC

## 2018-12-15 MED ORDER — SCOPOLAMINE 1 MG/3DAYS TD PT72
MEDICATED_PATCH | TRANSDERMAL | Status: AC
  Filled 2018-12-15: qty 1

## 2018-12-15 MED ORDER — OXYTOCIN 10 UNIT/ML IJ SOLN
INTRAMUSCULAR | Status: DC | PRN
  Administered 2018-12-15: 40 [IU]

## 2018-12-15 MED ORDER — SCOPOLAMINE 1 MG/3DAYS TD PT72
MEDICATED_PATCH | TRANSDERMAL | Status: DC | PRN
  Administered 2018-12-15: 1 via TRANSDERMAL

## 2018-12-15 MED ORDER — PHENYLEPHRINE 40 MCG/ML (10ML) SYRINGE FOR IV PUSH (FOR BLOOD PRESSURE SUPPORT)
PREFILLED_SYRINGE | INTRAVENOUS | Status: AC
  Filled 2018-12-15: qty 10

## 2018-12-15 MED ORDER — LACTATED RINGERS IV SOLN
INTRAVENOUS | Status: DC
  Administered 2018-12-15: 16:00:00 via INTRAVENOUS
  Administered 2018-12-16: 37.5 mL/h via INTRAVENOUS

## 2018-12-15 MED ORDER — PROMETHAZINE HCL 25 MG/ML IJ SOLN: 6.2500 mg | INTRAMUSCULAR | Status: DC | PRN

## 2018-12-15 MED ORDER — SENNOSIDES-DOCUSATE SODIUM 8.6-50 MG PO TABS
2.0000 | ORAL_TABLET | ORAL | Status: DC
  Administered 2018-12-15 – 2018-12-17 (×3): 2 via ORAL
  Filled 2018-12-15 (×3): qty 2

## 2018-12-15 MED ORDER — ONDANSETRON HCL 4 MG/2ML IJ SOLN: 4.0000 mg | Freq: Three times a day (TID) | INTRAMUSCULAR | Status: DC | PRN

## 2018-12-15 MED ORDER — WITCH HAZEL-GLYCERIN EX PADS: 1.0000 "application " | MEDICATED_PAD | CUTANEOUS | Status: DC | PRN

## 2018-12-15 MED ORDER — ACETAMINOPHEN 10 MG/ML IV SOLN
1000.0000 mg | Freq: Once | INTRAVENOUS | Status: DC | PRN
  Administered 2018-12-15: 1000 mg via INTRAVENOUS

## 2018-12-15 SURGICAL SUPPLY — 35 items
BENZOIN TINCTURE PRP APPL 2/3 (GAUZE/BANDAGES/DRESSINGS) ×3 IMPLANT
CHLORAPREP W/TINT 26ML (MISCELLANEOUS) ×3 IMPLANT
CLAMP CORD UMBIL (MISCELLANEOUS) IMPLANT
CLOSURE WOUND 1/2 X4 (GAUZE/BANDAGES/DRESSINGS) ×1
CLOTH BEACON ORANGE TIMEOUT ST (SAFETY) ×3 IMPLANT
DRSG OPSITE POSTOP 4X10 (GAUZE/BANDAGES/DRESSINGS) ×3 IMPLANT
ELECT REM PT RETURN 9FT ADLT (ELECTROSURGICAL) ×3
ELECTRODE REM PT RTRN 9FT ADLT (ELECTROSURGICAL) ×1 IMPLANT
EXTRACTOR VACUUM M CUP 4 TUBE (SUCTIONS) IMPLANT
EXTRACTOR VACUUM M CUP 4' TUBE (SUCTIONS)
GLOVE BIOGEL PI IND STRL 7.0 (GLOVE) ×2 IMPLANT
GLOVE BIOGEL PI IND STRL 7.5 (GLOVE) ×2 IMPLANT
GLOVE BIOGEL PI INDICATOR 7.0 (GLOVE) ×4
GLOVE BIOGEL PI INDICATOR 7.5 (GLOVE) ×4
GLOVE ECLIPSE 7.5 STRL STRAW (GLOVE) ×3 IMPLANT
GOWN STRL REUS W/TWL LRG LVL3 (GOWN DISPOSABLE) ×9 IMPLANT
KIT ABG SYR 3ML LUER SLIP (SYRINGE) IMPLANT
NEEDLE HYPO 25X5/8 SAFETYGLIDE (NEEDLE) IMPLANT
NS IRRIG 1000ML POUR BTL (IV SOLUTION) ×3 IMPLANT
PACK C SECTION WH (CUSTOM PROCEDURE TRAY) ×3 IMPLANT
PAD ABD 7.5X8 STRL (GAUZE/BANDAGES/DRESSINGS) ×3 IMPLANT
PAD OB MATERNITY 4.3X12.25 (PERSONAL CARE ITEMS) ×3 IMPLANT
PENCIL SMOKE EVAC W/HOLSTER (ELECTROSURGICAL) ×3 IMPLANT
RTRCTR C-SECT PINK 25CM LRG (MISCELLANEOUS) ×3 IMPLANT
SPONGE GAUZE 4X4 12PLY STER LF (GAUZE/BANDAGES/DRESSINGS) ×6 IMPLANT
STRIP CLOSURE SKIN 1/2X4 (GAUZE/BANDAGES/DRESSINGS) ×2 IMPLANT
SUT VIC AB 0 CT1 36 (SUTURE) ×3 IMPLANT
SUT VIC AB 0 CTX 36 (SUTURE) ×4
SUT VIC AB 0 CTX36XBRD ANBCTRL (SUTURE) ×2 IMPLANT
SUT VIC AB 2-0 CT1 27 (SUTURE) ×2
SUT VIC AB 2-0 CT1 TAPERPNT 27 (SUTURE) ×1 IMPLANT
SUT VIC AB 4-0 KS 27 (SUTURE) ×3 IMPLANT
TOWEL OR 17X24 6PK STRL BLUE (TOWEL DISPOSABLE) ×3 IMPLANT
TRAY FOLEY W/BAG SLVR 14FR LF (SET/KITS/TRAYS/PACK) ×3 IMPLANT
WATER STERILE IRR 1000ML POUR (IV SOLUTION) ×3 IMPLANT

## 2018-12-15 NOTE — Anesthesia Preprocedure Evaluation (Signed)
Anesthesia Evaluation  Patient identified by MRN, date of birth, ID band Patient awake    Reviewed: Allergy & Precautions, H&P , NPO status , Patient's Chart, lab work & pertinent test results  Airway Mallampati: I  TM Distance: >3 FB Neck ROM: full    Dental no notable dental hx. (+) Teeth Intact   Pulmonary neg pulmonary ROS, Current Smoker,    Pulmonary exam normal breath sounds clear to auscultation       Cardiovascular hypertension, Pt. on medications negative cardio ROS Normal cardiovascular exam Rhythm:regular Rate:Normal     Neuro/Psych negative psych ROS   GI/Hepatic negative GI ROS, Neg liver ROS,   Endo/Other  negative endocrine ROS  Renal/GU negative Renal ROS     Musculoskeletal   Abdominal Normal abdominal exam  (+)   Peds  Hematology  (+) Blood dyscrasia, anemia ,   Anesthesia Other Findings   Reproductive/Obstetrics (+) Pregnancy                             Anesthesia Physical Anesthesia Plan  ASA: II  Anesthesia Plan: Spinal   Post-op Pain Management:    Induction:   PONV Risk Score and Plan: 2 and Ondansetron, Dexamethasone and Scopolamine patch - Pre-op  Airway Management Planned: Nasal Cannula, Natural Airway and Simple Face Mask  Additional Equipment: None  Intra-op Plan:   Post-operative Plan:   Informed Consent: I have reviewed the patients History and Physical, chart, labs and discussed the procedure including the risks, benefits and alternatives for the proposed anesthesia with the patient or authorized representative who has indicated his/her understanding and acceptance.       Plan Discussed with: CRNA  Anesthesia Plan Comments:         Anesthesia Quick Evaluation

## 2018-12-15 NOTE — Progress Notes (Signed)
OB Update note  I was called from Dr. Donalee Citrin and [facility] 4/8 and recommend delivery based on EFM. If reactive and reassuring can wait for 2nd bmz and delivery at 34/0 tomorrow, but if not recommend delivery today.  Fetus with normal baseline but minimal variability and had one slight episode decel and no accel and tocometry quiet. Given this, I d/w pt that I don't recommend IOL or a contraction stress test and recommend proceeding with delivery via c-section. She is in agreement with this.  Pt got a dose of lovenox 40 at 2200 yesterday and she states she ate a full breakfast at around 7-8am today.  D/w OR and anesthesia and I recommend proceeding with delivery urgently and not waiting 8 hours s/p last PO with risks of waiting on delivery outweighing mom's risk  Recommend getting arterial and venous cord gases on newborn since preterm and fetal indication  Durene Romans MD Attending Center for [employer] (Faculty Practice) 12/15/2018 Time: 1140am

## 2018-12-15 NOTE — Anesthesia Postprocedure Evaluation (Signed)
Anesthesia Post Note  Patient: Toni Maynard  Procedure(s) Performed: CESAREAN SECTION (N/A Abdomen)     Patient location during evaluation: PACU Anesthesia Type: Spinal Level of consciousness: awake Pain management: pain level controlled Vital Signs Assessment: post-procedure vital signs reviewed and stable Respiratory status: spontaneous breathing Cardiovascular status: stable Postop Assessment: no headache, no backache, spinal receding and no apparent nausea or vomiting Anesthetic complications: no    Last Vitals:  Vitals:   12/15/18 1445 12/15/18 1500  BP: (!) 128/95 (!) 135/92  Pulse: 65 63  Resp: 18 20  Temp:    SpO2: 100% 98%    Last Pain:  Vitals:   12/15/18 1500  TempSrc:   PainSc: 0-No pain   Pain Goal: Patients Stated Pain Goal: 5 (12/15/18 1430)              Epidural/Spinal Function Cutaneous sensation: Vague (12/15/18 1500), Patient able to flex knees: Yes (12/15/18 1500), Patient able to lift hips off bed: No (12/15/18 1500), Back pain beyond tenderness at insertion site: No (12/15/18 1500), Progressively worsening motor and/or sensory loss: No (12/15/18 1500), Bowel and/or bladder incontinence post epidural: No (12/15/18 1500)  Huston Foley

## 2018-12-15 NOTE — Op Note (Signed)
Cesarean Section Operative Report  Toni Maynard  12/14/2018 - 12/15/2018  Indications: fetal indications   Pre-operative Diagnosis: fetal indication.   Post-operative Diagnosis: Same   Surgeon: Surgeon(s) and Role:    * Victor Langenbach, Ailene Rud, MD - Primary    * Dione Plover, Annice Needy, MD - Fellow   Attending Attestation: I was present and scrubbed for the entire procedure.   Anesthesia: spinal    Estimated Blood Loss: 153 ml  Total IV Fluids: 700 ml LR  Urine Output:: 500 ml clear yellow urine  Specimens: placenta to pathology  Findings: Viable female infant in cephalic presentation; Apgars pending; weight pending g; arterial cord pH obtained; clear amniotic fluid; intact placenta with three vessel cord; normal uterus, fallopian tubes and ovaries bilaterally.  Baby condition / location:  NICU   Complications: no complications  Indications: Toni Maynard is a 33 y.o. [phone] with an IUP [redacted]w[redacted]d presenting with severe superimposed preeclampsia, no-reassuring fetal monitoring. See progress note by Dr. Ilda Basset for details.  The risks, benefits, complications, treatment options, and exected outcomes were discussed with the patient . The patient dwith the proposed plan, giving informed consent. identified as Toni Maynard and the procedure verified as C-Section Delivery.  Procedure Details:  The patient was taken back to the operative suite where spinal anesthesia was placed.  A time out was held and the above information confirmed. Given elevated risk for PPH, tranexamic acid was given prophylactically just prior to skin incision.  After induction of anesthesia, the patient was draped and prepped in the usual sterile manner and placed in a dorsal supine position with a leftward tilt. A Pfannenstiel incision was made and carried down through the subcutaneous tissue to the fascia. Fascial incision was made and sharply extended transversely. The fascia was separated from the  underlying rectus tissue superiorly and inferiorly. The peritoneum was identified and bluntly entered and extended longitudinally. Alexis retractor was placed. A low transverse uterine incision was made and extended bluntly. Delivered from cephalic presentation was a viable infant with Apgars and weight as above.  After waiting 60 seconds for delayed cord cutting, the umbilical cord was clamped and cut cord blood was obtained for evaluation. Cord ph was sent. The placenta was removed Intact and appeared calcified. The uterine outline, tubes and ovaries appeared normal. The uterine incision was closed with running locked sutures of 0Vicryl with an imbricating layer of the same.   Hemostasis was observed. The hysterotomy did extend to the right uterine margin. Uterus exteriorized and posterior and right margin carefully examined and hemostasis was observed. The peritoneum was closed with 2-0 vicryl. The rectus muscles were examined and hemostasis observed. The fascia was then reapproximated with running sutures of 0Vicryl. The skin was closed with 4-0Vicryl.   Instrument, sponge, and needle counts were correct prior the abdominal closure and were correct at the conclusion of the case.       Disposition: PACU - hemodynamically stable.   Maternal Condition: stable       Signed: Patsy Lager WoukMD 12/15/2018 1:38 PM

## 2018-12-15 NOTE — Transfer of Care (Signed)
Immediate Anesthesia Transfer of Care Note  Patient: Toni Maynard  Procedure(s) Performed: CESAREAN SECTION (N/A Abdomen)  Patient Location: PACU  Anesthesia Type:Spinal  Level of Consciousness: awake, alert  and oriented  Airway & Oxygen Therapy: Patient Spontanous Breathing  Post-op Assessment: Report given to RN and Post -op Vital signs reviewed and stable  Post vital signs: Reviewed and stable  Last Vitals:  Vitals Value Taken Time  BP 138/90 12/15/18 1400  Temp    Pulse    Resp 13 12/15/18 1400  SpO2    Vitals shown include unvalidated device data.  Last Pain:  Vitals:   12/15/18 1138  TempSrc: Oral  PainSc:       Patients Stated Pain Goal: 2 (78/24/23 5361)  Complications: No apparent anesthesia complications

## 2018-12-15 NOTE — Consult Note (Signed)
Asked by Dr.Pickens to provide prenatal consultation for patient at risk for preterm delivery due to gestational HTN/preeclampsia.  Mother is 33 y.o. G9 P7-1-0-8 who is now 33.[redacted] weeks EGA, with pregnancy complicated by chronic HTN, now with superimposed preeclampsia.  She is being treated with betamethasone and MgSO4.  Discussed usual expectations for preterm infant at 67 - [redacted] weeks gestation, including possible needs for DR resuscitation, respiratory support, IV access, and temp support (incubator).  Projected possible length of stay in NICU until [facility].  Discussed advantages of feeding with mother's milk.  She plans to pump postnatally.  Patient was attentive, had appropriate questions, and was appreciative of my input.  Her mother (baby's MGM) also participated via Facetime.  Thank you for consulting Neonatology.  Total time 20 minutes - all face-to-face  JWimmer, MD

## 2018-12-15 NOTE — Progress Notes (Addendum)
Daily Antepartum Note  Admission Date: 12/14/2018 Current Date: 12/15/2018 9:02 AM  Toni Maynard is a 33 y.o. [id number] @ [redacted]w[redacted]d, HD#2, admitted for HA and ?severe pre-eclampsia based on HA.  Pregnancy complicated by: Patient Active Problem List   Diagnosis Date Noted  . Headache in pregnancy 12/15/2018  . Gestational thrombocytopenia without hemorrhage ([place]) 12/15/2018  . Hypokalemia 12/15/2018  . Pre-eclampsia affecting pregnancy, antepartum 12/14/2018  . Anemia of pregnancy, third trimester 11/08/2018  . Supervision of high risk pregnancy, antepartum 10/20/2018  . Advanced maternal age in multigravida 10/20/2018  . Late prenatal care 10/20/2018  . [place] multiparity 10/20/2018  . Chronic hypertension 10/20/2018  . Smoker 10/20/2018    Overnight/24hr events:  See H&P  Subjective:  Still with HA (stable) and feels occasional UCs and "pain all over". No visual s/s, LOF or VB.   Objective:    Current Vital Signs 24h Vital Sign Ranges  T 98 F (36.7 C) Temp  Avg: 98.2 F (36.8 C)  Min: 98 F (36.7 C)  Max: 98.6 F (37 C)  BP (!) 148/87 BP  Min: 129/82  Max: 151/98  HR 69 Pulse  Avg: 66  Min: 60  Max: 74  RR 20 Resp  Avg: 18  Min: 12  Max: 20  SaO2 100 % Room Air SpO2  Avg: 99.9 %  Min: 99 %  Max: 100 %       24 Hour I/O Current Shift I/O  Time Ins Outs 08/04 0701 - 08/05 0700 In: 702 [P.O.:702] Out: 2250 [Urine:2250] 08/05 0701 - 08/05 1900 In: 120 [P.O.:120] Out: 1 [Urine:1]   Patient Vitals for the past 24 hrs:  BP Temp Temp src Pulse Resp SpO2  12/15/18 0800 (!) 148/87 98 F (36.7 C) - 69 20 100 %  12/15/18 0700 - - - - 20 -  12/15/18 0600 - - - - 18 -  12/15/18 0500 - - - - 18 -  12/15/18 0400 (!) 149/92 98 F (36.7 C) - 64 18 99 %  12/15/18 0300 (!) 144/89 98 F (36.7 C) - 64 18 100 %  12/15/18 0201 (!) 147/97 - - 63 18 -  12/15/18 0101 (!) 147/80 - - 68 18 -  12/15/18 0001 138/80 - - 62 20 -  12/14/18 2300 129/82 98.6 F (37 C) Oral 65 18 100 %   12/14/18 2201 134/82 98 F (36.7 C) Oral 65 18 100 %  12/14/18 2110 130/84 98 F (36.7 C) Oral 65 18 100 %  12/14/18 2040 137/85 98.2 F (36.8 C) Oral 62 18 100 %  12/14/18 2025 (!) 150/93 - - 61 - -  12/14/18 1930 (!) 147/93 - - 65 - -  12/14/18 1915 (!) 145/92 - - 60 - -  12/14/18 1900 (!) 138/93 - - 65 - -  12/14/18 1845 (!) 138/94 - - 66 - -  12/14/18 1830 (!) 139/96 - - 68 - -  12/14/18 1815 (!) 144/90 - - 66 - -  12/14/18 1800 (!) 151/98 - - 63 - -  12/14/18 1745 (!) 151/94 - - 72 - -  12/14/18 1730 (!) 149/95 - - 68 - -  12/14/18 1715 138/90 - - 69 - -  12/14/18 1700 (!) 140/93 - - 74 - 100 %  12/14/18 1657 (!) 140/93 98.4 F (36.9 C) - 74 12 100 %   UOP: >129mL/hr  FHT: 120 baseline, no accels or decel, mod variability Toco: +irritability, occasional UCs  Physical exam:  General: Well nourished, well developed female and appears lethargic. +diffuse facial edema Abdomen: gravid nttp Cardiovascular: S1, S2 normal, no murmur, rub or gallop, regular rate and rhythm Respiratory: CTAB Extremities: no clubbing, cyanosis or edema Skin: Warm and dry.  SVE: 1/long/high (no change from MAU)  Medications: Current Facility-Administered Medications  Medication Dose Route Frequency Provider Last Rate Last Dose  . 0.9 %  sodium chloride infusion  250 mL Intravenous PRN Reva BoresPratt, Tanya S, MD      . acetaminophen (TYLENOL) tablet 650 mg  650 mg Oral Q4H PRN Reva BoresPratt, Tanya S, MD   650 mg at 12/15/18 0404  . aspirin EC tablet 81 mg  81 mg Oral Daily Reva BoresPratt, Tanya S, MD   81 mg at 12/15/18 0854  . betamethasone acetate-betamethasone sodium phosphate (CELESTONE) injection 12 mg  12 mg Intramuscular Q24H Reva BoresPratt, Tanya S, MD   12 mg at 12/14/18 2150  . calcium carbonate (TUMS - dosed in mg elemental calcium) chewable tablet 400 mg of elemental calcium  2 tablet Oral Q4H PRN Reva BoresPratt, Tanya S, MD      . docusate sodium (COLACE) capsule 100 mg  100 mg Oral Daily Reva BoresPratt, Tanya S, MD   100 mg at  12/15/18 0854  . lactated ringers infusion   Intravenous Continuous [place] BingPickens, Calani Gick, MD      . magnesium sulfate 40 grams in LR 500 mL OB infusion  2 g/hr Intravenous Titrated [place] BingPickens, Carlee Vonderhaar, MD 25 mL/hr at 12/15/18 0700 2 g/hr at 12/15/18 0700  . potassium chloride SA (K-DUR) CR tablet 40 mEq  40 mEq Oral BID [place] BingPickens, Shahed Yeoman, MD   40 mEq at 12/15/18 0852  . prenatal multivitamin tablet 1 tablet  1 tablet Oral Q1200 Reva BoresPratt, Tanya S, MD   1 tablet at 12/15/18 0854  . sodium chloride flush (NS) 0.9 % injection 3 mL  3 mL Intravenous Q12H Reva BoresPratt, Tanya S, MD      . sodium chloride flush (NS) 0.9 % injection 3 mL  3 mL Intravenous PRN Reva BoresPratt, Tanya S, MD      . zolpidem (AMBIEN) tablet 5 mg  5 mg Oral QHS PRN Reva BoresPratt, Tanya S, MD        Labs:  Recent Labs  Lab 12/14/18 1712 12/14/18 2010  WBC 9.2 9.3  HGB 9.9* 10.2*  HCT 29.5* 30.7*  PLT 132* 140*    Recent Labs  Lab 12/14/18 1712  NA 137  K 2.9*  CL 105  CO2 23  BUN <5*  CREATININE 0.61  CALCIUM 8.3*  PROT 6.0*  BILITOT 0.3  ALKPHOS 61  ALT 8  AST 11*  GLUCOSE 81   PC ratio 170  Radiology: no new imaging  Assessment & Plan:  Pt stable *Pregnancy: category I tracing. Depo provera *Severe pre-eclampsia superimposed on cHTN: d/w her that I'd give her the dx of severe pre-x based on persistent neuro s/s. Will try oxycodone and IV reglan for HA. Continue Mg. bpp for today. Rpt labs today.  *Gest thrombocytopenia: see above. Likely to be helped by bmz course.  -7/28: ceph, afi 12, efw 19%, 1871gm, AC @ 31% *Preterm: BMZ #2 @ 2000 today. Will have nicu come by. D/w pt re: preterm status and need for nicu stay *AMA: no issues *PPx: SCDs *FEN/GI: regular diet, MIVF, kdur *Dispo: for IOL tomorrow morning  Cornelia Copaharlie Jasreet Dickie, Jr. MD Attending Center for [facility](Faculty Practice)

## 2018-12-15 NOTE — Anesthesia Procedure Notes (Signed)
Spinal  Patient location during procedure: OR Start time: 12/15/2018 12:28 PM End time: 12/15/2018 12:29 PM Staffing Anesthesiologist: Lyn Hollingshead, MD Performed: anesthesiologist  Preanesthetic Checklist Completed: patient identified, site marked, surgical consent, pre-op evaluation, timeout performed, IV checked, risks and benefits discussed and monitors and equipment checked Spinal Block Patient position: sitting Prep: site prepped and draped and DuraPrep Patient monitoring: continuous pulse ox and blood pressure Approach: midline Location: L3-4 Injection technique: single-shot Needle Needle type: Pencan  Needle gauge: 24 G Needle length: 10 cm Needle insertion depth: 5 cm Assessment Sensory level: T4

## 2018-12-16 ENCOUNTER — Encounter (HOSPITAL_COMMUNITY): Payer: Self-pay

## 2018-12-16 DIAGNOSIS — Z8759 Personal history of other complications of pregnancy, childbirth and the puerperium: Secondary | ICD-10-CM | POA: Diagnosis present

## 2018-12-16 DIAGNOSIS — O1410 Severe pre-eclampsia, unspecified trimester: Secondary | ICD-10-CM | POA: Diagnosis present

## 2018-12-16 LAB — COMPREHENSIVE METABOLIC PANEL WITH GFR
ALT: 8 U/L (ref 0–44)
AST: 13 U/L — ABNORMAL LOW (ref 15–41)
Albumin: 2.8 g/dL — ABNORMAL LOW (ref 3.5–5.0)
Alkaline Phosphatase: 57 U/L (ref 38–126)
Anion gap: 6 (ref 5–15)
BUN: <5 mg/dL — ABNORMAL LOW (ref 6–20)
CO2: 24 mmol/L (ref 22–32)
Calcium: 6.5 mg/dL — ABNORMAL LOW (ref 8.9–10.3)
Chloride: 107 mmol/L (ref 98–111)
Creatinine, Ser: 0.57 mg/dL (ref 0.44–1.00)
GFR calc Af Amer: >60 mL/min
GFR calc non Af Amer: >60 mL/min
Glucose, Bld: 105 mg/dL — ABNORMAL HIGH (ref 70–99)
Potassium: 3.6 mmol/L (ref 3.5–5.1)
Sodium: 137 mmol/L (ref 135–145)
Total Bilirubin: <0.1 mg/dL — ABNORMAL LOW (ref 0.3–1.2)
Total Protein: 6 g/dL — ABNORMAL LOW (ref 6.5–8.1)

## 2018-12-16 LAB — CBC
HCT: 29.6 % — ABNORMAL LOW (ref 36.0–46.0)
Hemoglobin: 9.4 g/dL — ABNORMAL LOW (ref 12.0–15.0)
MCH: 29.6 pg (ref 26.0–34.0)
MCHC: 31.8 g/dL (ref 30.0–36.0)
MCV: 93.1 fL (ref 80.0–100.0)
Platelets: 148 K/uL — ABNORMAL LOW (ref 150–400)
RBC: 3.18 MIL/uL — ABNORMAL LOW (ref 3.87–5.11)
RDW: 16.4 % — ABNORMAL HIGH (ref 11.5–15.5)
WBC: 16.4 K/uL — ABNORMAL HIGH (ref 4.0–10.5)
nRBC: 0 % (ref 0.0–0.2)

## 2018-12-16 LAB — SYPHILIS: RPR W/REFLEX TO RPR TITER AND TREPONEMAL ANTIBODIES, TRADITIONAL SCREENING AND DIAGNOSIS ALGORITHM: RPR Ser Ql: NONREACTIVE

## 2018-12-16 LAB — VITAMIN D 25 HYDROXY (VIT D DEFICIENCY, FRACTURES): Vit D, 25-Hydroxy: 8.7 ng/mL — ABNORMAL LOW (ref 30.0–100.0)

## 2018-12-16 MED ORDER — NIFEDIPINE ER OSMOTIC RELEASE 30 MG PO TB24
30.0000 mg | ORAL_TABLET | Freq: Every day | ORAL | Status: DC
  Administered 2018-12-16 – 2018-12-18 (×3): 30 mg via ORAL
  Filled 2018-12-16 (×3): qty 1

## 2018-12-16 MED ORDER — NICOTINE 21 MG/24HR TD PT24
21.0000 mg | MEDICATED_PATCH | Freq: Every day | TRANSDERMAL | Status: DC
  Administered 2018-12-16 – 2018-12-18 (×3): 21 mg via TRANSDERMAL
  Filled 2018-12-16: qty 3
  Filled 2018-12-16 (×2): qty 1
  Filled 2018-12-16: qty 3
  Filled 2018-12-16: qty 1

## 2018-12-16 NOTE — Plan of Care (Signed)
  Problem: Education: Goal: Knowledge of condition will improve Outcome: Completed/Met   Problem: Education: Goal: Knowledge of General Education information will improve Description: Including pain rating scale, medication(s)/side effects and non-pharmacologic comfort measures Outcome: Completed/Met   Problem: Nutrition: Goal: Adequate nutrition will be maintained Outcome: Completed/Met   Problem: Coping: Goal: Level of anxiety will decrease Outcome: Completed/Met   Problem: Clinical Measurements: Goal: Respiratory complications will improve Outcome: Not Applicable Goal: Cardiovascular complication will be avoided Outcome: Not Applicable

## 2018-12-16 NOTE — Progress Notes (Signed)
Daily Postpartum Note  Admission Date: 12/14/2018 Current Date: 12/16/2018 8:33 AM  Toni Maynard is a 33 y.o. [id number] POD#1 s/p pLTCS for NRFHT @ 33/6, admitted for severe pre-x.  Pregnancy complicated by: Patient Active Problem List   Diagnosis Date Noted  . Severe preeclampsia 12/16/2018  . Headache in pregnancy 12/15/2018  . Gestational thrombocytopenia without hemorrhage ([facility]) 12/15/2018  . Hypokalemia 12/15/2018  . Pre-eclampsia affecting pregnancy, antepartum 12/14/2018  . Anemia of pregnancy, third trimester 11/08/2018  . Supervision of high risk pregnancy, antepartum 10/20/2018  . Advanced maternal age in multigravida 10/20/2018  . Late prenatal care 10/20/2018  . [place] multiparity 10/20/2018  . Chronic hypertension 10/20/2018  . Smoker 10/20/2018    Overnight/24hr events:  none  Subjective:  No s/s of pre-eclampsia. Just voided after foley out, taking some po. Having incisional pain.   Objective:    Current Vital Signs 24h Vital Sign Ranges  T 97.9 F (36.6 C) Temp  Avg: 97.9 F (36.6 C)  Min: 97.4 F (36.3 C)  Max: 98.3 F (36.8 C)  BP (!) 151/102 BP  Min: 119/73  Max: 151/102  HR 61 Pulse  Avg: 65.3  Min: 60  Max: 73  RR 17 Resp  Avg: 17.4  Min: 13  Max: 20  SaO2 100 % Room Air SpO2  Avg: 98.6 %  Min: 95 %  Max: 100 %       24 Hour I/O Current Shift I/O  Time Ins Outs 08/05 0701 - 08/06 0700 In: 5626.3 [P.O.:2740; I.V.:2886.3] Out: 6130 [Urine:5951] 08/06 0701 - 08/06 1900 In: 175.6 [P.O.:90; I.V.:85.6] Out: -    Patient Vitals for the past 24 hrs:  BP Temp Temp src Pulse Resp SpO2 Height Weight  12/16/18 0821 (!) 151/102 97.9 F (36.6 C) Oral 61 - 100 % - -  12/16/18 0745 - - - - 17 - - -  12/16/18 0403 126/79 97.7 F (36.5 C) Oral 60 18 96 % - -  12/16/18 0000 - - - - - 100 % - -  12/15/18 2350 119/73 98 F (36.7 C) Oral 60 18 99 % - -  12/15/18 2225 - - - - 18 99 % - -  12/15/18 2128 - - - - 16 99 % - -  12/15/18 2105 - - - - 20 100 % -  -  12/15/18 2025 - - - - 18 95 % - -  12/15/18 1938 127/79 98.3 F (36.8 C) Oral 73 15 96 % - -  12/15/18 1730 132/74 98.1 F (36.7 C) Oral 72 16 100 % - -  12/15/18 1700 - - - - - 100 % - -  12/15/18 1640 - - - - - 96 % - -  12/15/18 1639 130/77 97.7 F (36.5 C) Oral 70 16 - - -  12/15/18 1635 - - - - - 100 % - -  12/15/18 1630 - - - - - 98 % - -  12/15/18 1625 - - - - - 96 % - -  12/15/18 1620 - - - - - 99 % - -  12/15/18 1615 - - - - - 99 % - -  12/15/18 1610 - - - - - 97 % - -  12/15/18 1605 - - - - - 99 % - -  12/15/18 1600 - - - - - 99 % - -  12/15/18 1555 - - - - - 100 % - -  12/15/18 1550 - - - - -  99 % - -  12/15/18 1530 129/85 97.9 F (36.6 C) Axillary 64 18 96 % - -  12/15/18 1502 - - - 69 18 98 % - -  12/15/18 1500 (!) 135/92 - - 63 20 98 % - -  12/15/18 1445 (!) 128/95 - - 65 18 100 % - -  12/15/18 1430 (!) 130/104 - - 66 15 100 % - -  12/15/18 1415 (!) 142/102 - - 64 19 100 % - -  12/15/18 1400 138/90 (!) 97.4 F (36.3 C) Oral 61 13 100 % - -  12/15/18 1138 (!) 148/86 98 F (36.7 C) Oral 66 20 100 % - -  12/15/18 0900 - - - - - - 5\' 6"  (1.676 m) 69.9 kg    Physical exam: General: Well nourished, well developed female in no acute distress. Abdomen: rare BS, soft, approp ttp, mildly distended. C/d/i pressure dressing Cardiovascular: S1, S2 normal, no murmur, rub or gallop, regular rate and rhythm Respiratory: CTAB Extremities: no clubbing, cyanosis or edema Skin: Warm and dry.   Medications: Current Facility-Administered Medications  Medication Dose Route Frequency Provider Last Rate Last Dose  . acetaminophen (TYLENOL) tablet 1,000 mg  1,000 mg Oral Q6H [place] BingPickens, Draken Farrior, MD   1,000 mg at 12/16/18 0818  . coconut oil  1 application Topical PRN Wouk, Wilfred CurtisNoah Bedford, MD      . witch hazel-glycerin (TUCKS) pad 1 application  1 application Topical PRN Wouk, Wilfred CurtisNoah Bedford, MD       And  . dibucaine (NUPERCAINAL) 1 % rectal ointment 1 application  1 application  Rectal PRN Wouk, Wilfred CurtisNoah Bedford, MD      . diphenhydrAMINE (BENADRYL) injection 12.5 mg  12.5 mg Intravenous Q4H PRN Hatchett, Susann GivensFranklin, MD   12.5 mg at 12/15/18 1407   Or  . diphenhydrAMINE (BENADRYL) capsule 25 mg  25 mg Oral Q4H PRN Leilani AbleHatchett, [place], MD   25 mg at 12/16/18 0206  . diphenhydrAMINE (BENADRYL) capsule 25 mg  25 mg Oral Q6H PRN Wouk, Wilfred CurtisNoah Bedford, MD      . enoxaparin (LOVENOX) injection 40 mg  40 mg Subcutaneous Q24H Kathrynn RunningWouk, Noah Bedford, MD   40 mg at 12/16/18 0818  . lactated ringers infusion   Intravenous Continuous [place] BingPickens, Jahzaria Vary, MD 100 mL/hr at 12/16/18 0756    . magnesium sulfate 40 grams in LR 500 mL OB infusion  2 g/hr Intravenous Titrated  BingPickens, Jerah Esty, MD 25 mL/hr at 12/16/18 0700 2 g/hr at 12/16/18 0700  . medroxyPROGESTERone (DEPO-PROVERA) injection 150 mg  150 mg Intramuscular Prior to discharge Wouk, Wilfred CurtisNoah Bedford, MD      . menthol-cetylpyridinium (CEPACOL) lozenge 3 mg  1 lozenge Oral Q2H PRN Wouk, Wilfred CurtisNoah Bedford, MD      . nalbuphine (NUBAIN) injection 5 mg  5 mg Intravenous Q4H PRN Leilani AbleHatchett, [place], MD   5 mg at 12/15/18 1746   Or  . nalbuphine (NUBAIN) injection 5 mg  5 mg Subcutaneous Q4H PRN Hatchett, Susann GivensFranklin, MD      . nalbuphine (NUBAIN) injection 5 mg  5 mg Intravenous Once PRN Leilani AbleHatchett, [place], MD       Or  . nalbuphine (NUBAIN) injection 5 mg  5 mg Subcutaneous Once PRN Leilani AbleHatchett, [place], MD      . naloxone [facility](NARCAN) injection 0.4 mg  0.4 mg Intravenous PRN Hatchett, Susann GivensFranklin, MD       And  . sodium chloride flush (NS) 0.9 % injection 3 mL  3 mL Intravenous PRN Leilani AbleHatchett, [place], MD      .  naloxone HCl (NARCAN) 2 mg in dextrose 5 % 250 mL infusion  1-4 mcg/kg/hr Intravenous Continuous PRN Hatchett, Mateo Flow, MD      . NIFEdipine (PROCARDIA-XL/NIFEDICAL-XL) 24 hr tablet 30 mg  30 mg Oral Daily Aletha Halim, MD      . ondansetron [facility]) injection 4 mg  4 mg Intravenous Q8H PRN Hatchett, [place], MD      . oxyCODONE (Oxy IR/ROXICODONE)  immediate release tablet 5-10 mg  5-10 mg Oral Q4H PRN Gwynne Edinger, MD   5 mg at 12/15/18 2357  . polyethylene glycol (MIRALAX / GLYCOLAX) packet 17 g  17 g Oral Daily Vasilia Dise, Eduard Clos, MD      . potassium chloride SA (K-DUR) CR tablet 40 mEq  40 mEq Oral BID Aletha Halim, MD   40 mEq at 12/15/18 2124  . prenatal multivitamin tablet 1 tablet  1 tablet Oral Q1200 Wouk, Ailene Rud, MD      . scopolamine (TRANSDERM-SCOP) 1 MG/3DAYS 1.5 mg  1 patch Transdermal Once Hatchett, Mateo Flow, MD      . senna-docusate (Senokot-S) tablet 2 tablet  2 tablet Oral Q24H Gwynne Edinger, MD   2 tablet at 12/15/18 2354  . simethicone (MYLICON) chewable tablet 80 mg  80 mg Oral TID PC Wouk, Ailene Rud, MD   80 mg at 12/16/18 0818  . Tdap (BOOSTRIX) injection 0.5 mL  0.5 mL Intramuscular Once Wouk, Ailene Rud, MD      . zolpidem [facility]) tablet 5 mg  5 mg Oral QHS PRN Gwynne Edinger, MD        Labs:  Recent Labs  Lab 12/14/18 2010 12/15/18 0912 12/16/18 0531  WBC 9.3 11.1* 16.4*  HGB 10.2* 10.9* 9.4*  HCT 30.7* 32.6* 29.6*  PLT 140* 155 148*    Recent Labs  Lab 12/14/18 1712 12/15/18 0912 12/16/18 0531  NA 137 138 137  K 2.9* 3.3* 3.6  CL 105 107 107  CO2 23 19* 24  BUN <5* <5* <5*  CREATININE 0.61 0.67 0.57  CALCIUM 8.3* 7.5* 6.5*  PROT 6.0* 6.5 6.0*  BILITOT 0.3 0.5 <0.1*  ALKPHOS 61 67 57  ALT 8 8 8   AST 11* 17 13*  GLUCOSE 81 138* 105*     Radiology: no new imaging  Assessment & Plan:  Pt doing well *PP/PO: routine care. Depo provera. Conflict (See Lab Report): B POS/B POS Performed at [facility], [place] [address] [place], [place] [address]  *cHTN with superimposed severe pre-x: will start procardia xl 30 qday. Mg until later this afternoon *PPx: lovenox *FEN/GI: MIVF with Mg. Regular diet *Dispo: hopefully pod#3. Will need 1wk bp check (request sent)  Durene Romans MD Attending Center for [facility])

## 2018-12-16 NOTE — Lactation Note (Addendum)
This note was copied from a baby's chart. Lactation Consultation Note  Patient Name: Toni Maynard [id number] Date: 12/16/2018 Reason for consult: Initial assessment;NICU baby  Infant is  40 hours old and was born at 13+6 weeks Mother is a P9. She reports that she attempt to breastfeed one of her children and her nipples became sore and cracked.  Mother reports that she has had breast changes with other children.  Assist mother with hand expression and breast massage.  No observed colostrum when hand expresses.   Staff nurse sat up DEBP for mother yesterday. Mother reports that she has pumped once with no observed colostrum.  Assist mother with pumping. Mother using a #24 flange. No colostrum observed.   Advised mother to pump every 2-3 hours for 15-20 mins.  Mother was given Breastfeeding Your NICU Baby.  Reviewed collection, storage , cleaning and transporting milk.  Lc brochure given to mother with information on all breastfeeding resources at [facility] and at [facility].  Mother reports that she is active with [facility]. A WIC referral was faxed to [facility],.  Maternal Data Has patient been taught Hand Expression?: Yes Does the patient have breastfeeding experience prior to this delivery?: Yes  Feeding    LATCH Score                   Interventions Interventions: Breast massage;Hand express;DEBP  Lactation Tools Discussed/Used WIC Program: Yes Pump Review: Setup, frequency, and cleaning;Milk Storage Initiated by:: Staff member Date initiated:: 12/15/18   Consult Status Consult Status: Follow-up Date: 12/17/18 Follow-up type: In-patient    Jess Barters [facility] 12/16/2018, 10:46 AM

## 2018-12-16 NOTE — Progress Notes (Signed)
Patient screened out for psychosocial assessment since none of the following apply: °Psychosocial stressors documented in mother or baby's chart °Gestation less than 32 weeks °Code at delivery  °Infant with anomalies °Please contact the Clinical Social Worker if specific needs arise, by MOB's request, or if MOB scores greater than 9/yes to question 10 on [place] Postpartum Depression Screen. ° °Toni Maynard, MSW, LCSW °Clinical Social Work °[phone] °  °

## 2018-12-17 MED ORDER — HYDROMORPHONE HCL 2 MG PO TABS
2.0000 mg | ORAL_TABLET | ORAL | Status: DC | PRN
  Administered 2018-12-17 – 2018-12-18 (×4): 2 mg via ORAL
  Filled 2018-12-17 (×5): qty 1

## 2018-12-17 NOTE — Progress Notes (Signed)
Daily Postpartum Note  Admission Date: 12/14/2018 Current Date: 12/17/2018 10:09 AM  Toni Maynard is a 33 y.o. [id number] POD#2 s/p pLTCS for NRFHT @ 33/6, admitted for severe pre-x.  Pregnancy complicated by: Patient Active Problem List   Diagnosis Date Noted  . Severe preeclampsia 12/16/2018  . Headache in pregnancy 12/15/2018  . Gestational thrombocytopenia without hemorrhage ([place]) 12/15/2018  . Hypokalemia 12/15/2018  . Pre-eclampsia affecting pregnancy, antepartum 12/14/2018  . Anemia of pregnancy, third trimester 11/08/2018  . Supervision of high risk pregnancy, antepartum 10/20/2018  . Advanced maternal age in multigravida 10/20/2018  . Late prenatal care 10/20/2018  . [place] multiparity 10/20/2018  . Chronic hypertension 10/20/2018  . Smoker 10/20/2018    Overnight/24hr events:  none  Subjective:  No flatus yet, taking PO and voiding fine, no nausea. Still having incisional pain.   Objective:    Current Vital Signs 24h Vital Sign Ranges  T 99 F (37.2 C) Temp  Avg: 98.4 F (36.9 C)  Min: 98.1 F (36.7 C)  Max: 99 F (37.2 C)  BP 130/83 BP  Min: 122/83  Max: 150/95  HR 79 Pulse  Avg: 73  Min: 60  Max: 84  RR 18 Resp  Avg: 17.8  Min: 16  Max: 18  SaO2 99 % Room Air SpO2  Avg: 99 %  Min: 99 %  Max: 99 %       24 Hour I/O Current Shift I/O  Time Ins Outs 08/06 0701 - 08/07 0700 In: 2403.9 [P.O.:1738; I.V.:665.9] Out: 3050 [Urine:3050] No intake/output data recorded.   Patient Vitals for the past 24 hrs:  BP Temp Temp src Pulse Resp SpO2  12/17/18 0746 130/83 99 F (37.2 C) Oral 79 18 99 %  12/17/18 0500 122/83 98.3 F (36.8 C) Oral 84 18 99 %  12/16/18 1948 (!) 150/95 98.3 F (36.8 C) Oral 70 18 99 %  12/16/18 1745 124/81 - - 60 18 99 %  12/16/18 1326 (!) 145/91 98.1 F (36.7 C) Oral 72 18 99 %  12/16/18 1300 - - - - 16 -  12/16/18 1211 - - - - 18 -  12/16/18 1100 - - - - 18 -    Physical exam: General: Well nourished, well developed female in no  acute distress. Abdomen: rare BS, soft, approp ttp, mildly distended. C/d/i pressure dressing Cardiovascular: S1, S2 normal, no murmur, rub or gallop, regular rate and rhythm Respiratory: CTAB Extremities: no clubbing, cyanosis or edema Skin: Warm and dry.   Medications: Current Facility-Administered Medications  Medication Dose Route Frequency Provider Last Rate Last Dose  . coconut oil  1 application Topical PRN Wouk, Ailene Rud, MD      . witch hazel-glycerin (TUCKS) pad 1 application  1 application Topical PRN Wouk, Ailene Rud, MD       And  . dibucaine (NUPERCAINAL) 1 % rectal ointment 1 application  1 application Rectal PRN Wouk, Ailene Rud, MD      . diphenhydrAMINE (BENADRYL) injection 12.5 mg  12.5 mg Intravenous Q4H PRN Hatchett, Mateo Flow, MD   12.5 mg at 12/15/18 1407   Or  . diphenhydrAMINE (BENADRYL) capsule 25 mg  25 mg Oral Q4H PRN Lyn Hollingshead, MD   25 mg at 12/16/18 0206  . diphenhydrAMINE (BENADRYL) capsule 25 mg  25 mg Oral Q6H PRN Wouk, Ailene Rud, MD      . enoxaparin (LOVENOX) injection 40 mg  40 mg Subcutaneous Q24H Wouk, Ailene Rud, MD   40 mg at  12/17/18 0900  . HYDROmorphone (DILAUDID) tablet 2-4 mg  2-4 mg Oral Q4H PRN [place] BingPickens, Saim Almanza, MD      . menthol-cetylpyridinium (CEPACOL) lozenge 3 mg  1 lozenge Oral Q2H PRN Wouk, Wilfred CurtisNoah Bedford, MD      . nalbuphine (NUBAIN) injection 5 mg  5 mg Intravenous Q4H PRN Leilani AbleHatchett, [place], MD   5 mg at 12/15/18 1746   Or  . nalbuphine (NUBAIN) injection 5 mg  5 mg Subcutaneous Q4H PRN Leilani AbleHatchett, [place], MD   5 mg at 12/16/18 1929  . nalbuphine (NUBAIN) injection 5 mg  5 mg Intravenous Once PRN Leilani AbleHatchett, [place], MD       Or  . nalbuphine (NUBAIN) injection 5 mg  5 mg Subcutaneous Once PRN Leilani AbleHatchett, [place], MD      . naloxone [facility](NARCAN) injection 0.4 mg  0.4 mg Intravenous PRN Leilani AbleHatchett, [place], MD       And  . sodium chloride flush (NS) 0.9 % injection 3 mL  3 mL Intravenous PRN Hatchett, Susann GivensFranklin, MD       . naloxone HCl (NARCAN) 2 mg in dextrose 5 % 250 mL infusion  1-4 mcg/kg/hr Intravenous Continuous PRN Hatchett, [place], MD      . nicotine (NICODERM CQ - dosed in mg/24 hours) patch 21 mg  21 mg Transdermal Daily [place] BingPickens, Brixton Franko, MD   21 mg at 12/16/18 1658  . NIFEdipine (PROCARDIA-XL/NIFEDICAL-XL) 24 hr tablet 30 mg  30 mg Oral Daily [place] BingPickens, Shefali Ng, MD   30 mg at 12/17/18 0900  . ondansetron (ZOFRAN) injection 4 mg  4 mg Intravenous Q8H PRN Hatchett, [place], MD      . polyethylene glycol (MIRALAX / GLYCOLAX) packet 17 g  17 g Oral Daily [place] BingPickens, Adrinne Sze, MD   17 g at 12/17/18 0845  . prenatal multivitamin tablet 1 tablet  1 tablet Oral Q1200 Kathrynn RunningWouk, Noah Bedford, MD   1 tablet at 12/16/18 1302  . scopolamine (TRANSDERM-SCOP) 1 MG/3DAYS 1.5 mg  1 patch Transdermal Once Hatchett, [place], MD      . senna-docusate (Senokot-S) tablet 2 tablet  2 tablet Oral Q24H Kathrynn RunningWouk, Noah Bedford, MD   2 tablet at 12/16/18 2357  . simethicone (MYLICON) chewable tablet 80 mg  80 mg Oral TID PC Wouk, Wilfred CurtisNoah Bedford, MD   80 mg at 12/17/18 0845  . zolpidem (AMBIEN) tablet 5 mg  5 mg Oral QHS PRN Kathrynn RunningWouk, Noah Bedford, MD        Labs:  Recent Labs  Lab 12/14/18 2010 12/15/18 0912 12/16/18 0531  WBC 9.3 11.1* 16.4*  HGB 10.2* 10.9* 9.4*  HCT 30.7* 32.6* 29.6*  PLT 140* 155 148*    Recent Labs  Lab 12/14/18 1712 12/15/18 0912 12/16/18 0531  NA 137 138 137  K 2.9* 3.3* 3.6  CL 105 107 107  CO2 23 19* 24  BUN <5* <5* <5*  CREATININE 0.61 0.67 0.57  CALCIUM 8.3* 7.5* 6.5*  PROT 6.0* 6.5 6.0*  BILITOT 0.3 0.5 <0.1*  ALKPHOS 61 67 57  ALT 8 8 8   AST 11* 17 13*  GLUCOSE 81 138* 105*     Radiology: no new imaging  Assessment & Plan:  Pt doing well *PP/PO: routine care. Depo provera. B pos. Will switch from oxycodone to po dilaudid *cHTN with superimposed severe pre-x: continue procardia xl *PPx: lovenox *FEN/GI: sliv, regular die *Dispo: hopefully pod#3. Will need 1wk bp check (request  sent)  Cornelia Copaharlie Takeshia Wenk, Jr. MD Attending [facility](Faculty Practice)

## 2018-12-17 NOTE — Lactation Note (Addendum)
This note was copied from a baby's chart. Lactation Consultation Note:  [place] arrived in mothers room and mother, reports, "Oh you are here to help me practice again". Mother reports that she is in pain. She didn't comment when I ask her if she needed assistance with pumping.  Mother reports that she just called her nurse and she was having gas pain. Pump sitting in the corner of the room. I am unsure of mothers desire to pump. Mother encouraged to pump every 2-3 hours . She will call as needed.  Patient Name: Toni Maynard EAVWU'J Date: 12/17/2018 Reason for consult: Follow-up assessment   Maternal Data    Feeding    LATCH Score                   Interventions    Lactation Tools Discussed/Used     Consult Status Consult Status: Follow-up Date: 12/18/18 Follow-up type: In-patient    Jess Barters [facility] 12/17/2018, 10:42 AM

## 2018-12-18 MED ORDER — NIFEDIPINE ER 30 MG PO TB24: 30.0000 mg | ORAL_TABLET | Freq: Every day | ORAL | 3 refills | Status: DC

## 2018-12-18 MED ORDER — LABETALOL HCL 100 MG PO TABS: 200.0000 mg | ORAL_TABLET | Freq: Two times a day (BID) | ORAL | 3 refills | Status: DC

## 2018-12-18 MED ORDER — OXYCODONE-ACETAMINOPHEN 5-325 MG PO TABS: 1.0000 | ORAL_TABLET | ORAL | 0 refills | Status: DC | PRN

## 2018-12-18 MED ORDER — LABETALOL HCL 200 MG PO TABS
200.0000 mg | ORAL_TABLET | Freq: Once | ORAL | Status: AC
  Administered 2018-12-18: 200 mg via ORAL
  Filled 2018-12-18: qty 1

## 2018-12-18 NOTE — Discharge Instructions (Signed)
Preeclampsia and Eclampsia °Preeclampsia is a serious condition that may develop during pregnancy. This condition causes high blood pressure and increased protein in your urine along with other symptoms, such as headaches and vision changes. These symptoms may develop as the condition gets worse. Preeclampsia may occur at 20 weeks of pregnancy or later. °Diagnosing and treating preeclampsia early is very important. If not treated early, it can cause serious problems for you and your baby. One problem it can lead to is eclampsia. Eclampsia is a condition that causes muscle jerking or shaking (convulsions or seizures) and other serious problems for the mother. During pregnancy, delivering your baby may be the best treatment for preeclampsia or eclampsia. For most women, preeclampsia and eclampsia symptoms go away after giving birth. °In rare cases, a woman may develop preeclampsia after giving birth (postpartum preeclampsia). This usually occurs within 48 hours after childbirth but may occur up to 6 weeks after giving birth. °What are the causes? °The cause of preeclampsia is not known. °What increases the risk? °The following risk factors make you more likely to develop preeclampsia: °· Being pregnant for the first time. °· Having had preeclampsia during a past pregnancy. °· Having a family history of preeclampsia. °· Having high blood pressure. °· Being pregnant with more than one baby. °· Being 35 or older. °· Being African-American. °· Having kidney disease or diabetes. °· Having medical conditions such as lupus or blood diseases. °· Being very overweight (obese). °What are the signs or symptoms? °The most common symptoms are: °· Severe headaches. °· Vision problems, such as blurred or double vision. °· Abdominal pain, especially upper abdominal pain. °Other symptoms that may develop as the condition gets worse include: °· Sudden weight gain. °· Sudden swelling of the hands, face, legs, and feet. °· Severe nausea  and vomiting. °· Numbness in the face, arms, legs, and feet. °· Dizziness. °· Urinating less than usual. °· Slurred speech. °· Convulsions or seizures. °How is this diagnosed? °There are no screening tests for preeclampsia. Your health care provider will ask you about symptoms and check for signs of preeclampsia during your prenatal visits. You may also have tests that include: °· Checking your blood pressure. °· Urine tests to check for protein. Your health care provider will check for this at every prenatal visit. °· Blood tests. °· Monitoring your baby's heart rate. °· Ultrasound. °How is this treated? °You and your health care provider will determine the treatment approach that is best for you. Treatment may include: °· Having more frequent prenatal exams to check for signs of preeclampsia, if you have an increased risk for preeclampsia. °· Medicine to lower your blood pressure. °· Staying in the hospital, if your condition is severe. There, treatment will focus on controlling your blood pressure and the amount of fluids in your body (fluid retention). °· Taking medicine (magnesium sulfate) to prevent seizures. This may be given as an injection or through an IV. °· Taking a low-dose aspirin during your pregnancy. °· Delivering your baby early. You may have your labor started with medicine (induced), or you may have a cesarean delivery. °Follow these instructions at home: °Eating and drinking ° °· Drink enough fluid to keep your urine pale yellow. °· Avoid caffeine. °Lifestyle °· Do not use any products that contain nicotine or tobacco, such as cigarettes and e-cigarettes. If you need help quitting, ask your health care provider. °· Do not use alcohol or drugs. °· Avoid stress as much as possible. Rest and get   plenty of sleep. °General instructions °· Take over-the-counter and prescription medicines only as told by your health care provider. °· When lying down, lie on your left side. This keeps pressure off your  major blood vessels. °· When sitting or lying down, raise (elevate) your feet. Try putting some pillows underneath your lower legs. °· Exercise regularly. Ask your health care provider what kinds of exercise are best for you. °· Keep all follow-up and prenatal visits as told by your health care provider. This is important. °How is this prevented? °There is no known way of preventing preeclampsia or eclampsia from developing. However, to lower your risk of complications and detect problems early: °· Get regular prenatal care. Your health care provider may be able to diagnose and treat the condition early. °· Maintain a healthy weight. Ask your health care provider for help managing weight gain during pregnancy. °· Work with your health care provider to manage any long-term (chronic) health conditions you have, such as diabetes or kidney problems. °· You may have tests of your blood pressure and kidney function after giving birth. °· Your health care provider may have you take low-dose aspirin during your next pregnancy. °Contact a health care provider if: °· You have symptoms that your health care provider told you may require more treatment or monitoring, such as: °? Headaches. °? Nausea or vomiting. °? Abdominal pain. °? Dizziness. °? Light-headedness. °Get help right away if: °· You have severe: °? Abdominal pain. °? Headaches that do not get better. °? Dizziness. °? Vision problems. °? Confusion. °? Nausea or vomiting. °· You have any of the following: °? A seizure. °? Sudden, rapid weight gain. °? Sudden swelling in your hands, ankles, or face. °? Trouble moving any part of your body. °? Numbness in any part of your body. °? Trouble speaking. °? Abnormal bleeding. °· You faint. °Summary °· Preeclampsia is a serious condition that may develop during pregnancy. °· This condition causes high blood pressure and increased protein in your urine along with other symptoms, such as headaches and vision  changes. °· Diagnosing and treating preeclampsia early is very important. If not treated early, it can cause serious problems for you and your baby. °· Get help right away if you have symptoms that your health care provider told you to watch for. °This information is not intended to replace advice given to you by your health care provider. Make sure you discuss any questions you have with your health care provider. °Document Released: 04/25/2000 Document Revised: 12/29/2017 Document Reviewed: 12/03/2015 °Elsevier Patient Education © 2020 [employer] °Cesarean Delivery, Care After °This sheet gives you information about how to care for yourself after your procedure. Your health care provider may also give you more specific instructions. If you have problems or questions, contact your health care provider. °What can I expect after the procedure? °After the procedure, it is common to have: °· A small amount of blood or clear fluid coming from the incision. °· Some redness, swelling, and pain in your incision area. °· Some abdominal pain and soreness. °· Vaginal bleeding (lochia). Even though you did not have a vaginal delivery, you will still have vaginal bleeding and discharge. °· Pelvic cramps. °· Fatigue. °You may have pain, swelling, and discomfort in the tissue between your vagina and your anus (perineum) if: °· Your C-section was unplanned, and you were allowed to labor and push. °· An incision was made in the area (episiotomy) or the tissue tore during attempted vaginal   delivery. °Follow these instructions at home: °Incision care ° °· Follow instructions from your health care provider about how to take care of your incision. Make sure you: °? Wash your hands with soap and water before you change your bandage (dressing). If soap and water are not available, use hand sanitizer. °? If you have a dressing, change it or remove it as told by your health care provider. °? Leave stitches (sutures), skin staples, skin  glue, or adhesive strips in place. These skin closures may need to stay in place for 2 weeks or longer. If adhesive strip edges start to loosen and curl up, you may trim the loose edges. Do not remove adhesive strips completely unless your health care provider tells you to do that. °· Check your incision area every day for signs of infection. Check for: °? More redness, swelling, or pain. °? More fluid or blood. °? Warmth. °? Pus or a bad smell. °· Do not take baths, swim, or use a hot tub until your health care provider says it's okay. Ask your health care provider if you can take showers. °· When you cough or sneeze, hug a pillow. This helps with pain and decreases the chance of your incision opening up (dehiscing). Do this until your incision heals. °Medicines °· Take over-the-counter and prescription medicines only as told by your health care provider. °· If you were prescribed an antibiotic medicine, take it as told by your health care provider. Do not stop taking the antibiotic even if you start to feel better. °· Do not drive or use heavy machinery while taking prescription pain medicine. °Lifestyle °· Do not drink alcohol. This is especially important if you are breastfeeding or taking pain medicine. °· Do not use any products that contain nicotine or tobacco, such as cigarettes, e-cigarettes, and chewing tobacco. If you need help quitting, ask your health care provider. °Eating and drinking °· Drink at least 8 eight-ounce glasses of water every day unless told not to by your health care provider. If you breastfeed, you may need to drink even more water. °· Eat high-fiber foods every day. These foods may help prevent or relieve constipation. High-fiber foods include: °? Whole grain cereals and breads. °? Brown rice. °? Beans. °? Fresh fruits and vegetables. °Activity ° °· If possible, have someone help you care for your baby and help with household activities for at least a few days after you leave the  hospital. °· Return to your normal activities as told by your health care provider. Ask your health care provider what activities are safe for you. °· Rest as much as possible. Try to rest or take a nap while your baby is sleeping. °· Do not lift anything that is heavier than 10 lbs (4.5 kg), or the limit that you were told, until your health care provider says that it is safe. °· Talk with your health care provider about when you can engage in sexual activity. This may depend on your: °? Risk of infection. °? How fast you heal. °? Comfort and desire to engage in sexual activity. °General instructions °· Do not use tampons or douches until your health care provider approves. °· Wear loose, comfortable clothing and a supportive and well-fitting bra. °· Keep your perineum clean and dry. Wipe from front to back when you use the toilet. °· If you pass a blood clot, save it and call your health care provider to discuss. Do not flush blood clots down the toilet before   you get instructions from your health care provider. °· Keep all follow-up visits for you and your baby as told by your health care provider. This is important. °Contact a health care provider if: °· You have: °? A fever. °? Bad-smelling vaginal discharge. °? Pus or a bad smell coming from your incision. °? Difficulty or pain when urinating. °? A sudden increase or decrease in the frequency of your bowel movements. °? More redness, swelling, or pain around your incision. °? More fluid or blood coming from your incision. °? A rash. °? Nausea. °? Little or no interest in activities you used to enjoy. °? Questions about caring for yourself or your baby. °· Your incision feels warm to the touch. °· Your breasts turn red or become painful or hard. °· You feel unusually sad or worried. °· You vomit. °· You pass a blood clot from your vagina. °· You urinate more than usual. °· You are dizzy or light-headed. °Get help right away if: °· You have: °? Pain that does  not go away or get better with medicine. °? Chest pain. °? Difficulty breathing. °? Blurred vision or spots in your vision. °? Thoughts about hurting yourself or your baby. °? New pain in your abdomen or in one of your legs. °? A severe headache. °· You faint. °· You bleed from your vagina so much that you fill more than one sanitary pad in one hour. Bleeding should not be heavier than your heaviest period. °Summary °· After the procedure, it is common to have pain at your incision site, abdominal cramping, and slight bleeding from your vagina. °· Check your incision area every day for signs of infection. °· Tell your health care provider about any unusual symptoms. °· Keep all follow-up visits for you and your baby as told by your health care provider. °This information is not intended to replace advice given to you by your health care provider. Make sure you discuss any questions you have with your health care provider. °Document Released: 01/18/2002 Document Revised: 11/04/2017 Document Reviewed: 11/04/2017 °Elsevier Patient Education © 2020 Elsevier Inc. ° °

## 2018-12-18 NOTE — Progress Notes (Signed)
Called Dr Kennon Rounds to inform her of patients BP's 140's over 90's.  Dr Kennon Rounds gave verbal order for labetalol 200mg .

## 2018-12-18 NOTE — Lactation Note (Signed)
This note was copied from a baby's chart. Lactation Consultation Note  Patient Name: Toni Maynard OVANV'B Date: 12/18/2018   P9.  Baby in NICU at 69 hours.  Mother has pumped x 1. Provided mother with manual pump, demonstrated use and suggest she pump for 10 min per side at home q 3 hours.  Reviewed milk storage. Also discussed using DEBP kit and pumping at bedside.  Reviewed engorgement care and encouraged her to pump q 3 hours. Offered mother [place] and mother states she will call if needed. [facility] referral has been faxed.      Maternal Data    Feeding Feeding Type: Donor Breast Milk  LATCH Score                   Interventions    Lactation Tools Discussed/Used     Consult Status      Carlye Grippe 12/18/2018, 9:54 AM

## 2018-12-18 NOTE — Progress Notes (Signed)
Discharge papers discussed with patient.  She was then escorted to the NICU to see her baby.  She states he will leave from there.

## 2018-12-18 NOTE — Discharge Summary (Signed)
Postpartum Discharge Summary     Patient Name: Toni Maynard DOB: 12-Apr-1986 MRN: [id number]  Date of admission: 12/14/2018 Delivering Provider: Shonna ChockWOUK, NOAH BEDFORD   Date of discharge: 12/18/2018  Admitting diagnosis: HBP Intrauterine pregnancy: 3860w6d     Secondary diagnosis:  Active Problems:   Pre-eclampsia affecting pregnancy, antepartum   Headache in pregnancy   Gestational thrombocytopenia without hemorrhage (HCC)   Hypokalemia   Severe preeclampsia  Additional problems:      Discharge diagnosis: Preterm Pregnancy Delivered                                                                                                Post partum procedures:BP meds  Augmentation: no  Complications: None  Hospital course:  Sceduled C/S   33 y.o. yo [id number] at 4760w6d was admitted to the hospital 12/14/2018 for scheduled cesarean section with the following indication:Non-Reassuring FHR.  Membrane Rupture Time/Date: 12:52 PM ,12/15/2018   Patient delivered a Viable infant.12/15/2018  Details of operation can be found in separate operative note.  Pateint had an uncomplicated postpartum course.  She is ambulating, tolerating a regular diet, passing flatus, and urinating well. Patient is discharged home in stable condition on  12/18/18         Magnesium Sulfate recieved: Yes BMZ received: No  Physical exam  Vitals:   12/17/18 1200 12/17/18 1553 12/17/18 2123 12/18/18 0500  BP: (!) 143/86 (!) 145/88 (!) 149/90 (!) 146/92  Pulse: 74 75 75 80  Resp: 17 18 18 18   Temp: 99.3 F (37.4 C) 99 F (37.2 C) 98.4 F (36.9 C) 98.3 F (36.8 C)  TempSrc: Oral Oral Oral Oral  SpO2: 100% 97% 100% 100%  Weight:      Height:       General: alert Lochia: appropriate Uterine Fundus: firm Incision: Dressing is clean, dry, and intact DVT Evaluation: No evidence of DVT seen on physical exam. Labs: Lab Results  Component Value Date   WBC 16.4 (H) 12/16/2018   HGB 9.4 (L) 12/16/2018   HCT 29.6 (L)  12/16/2018   MCV 93.1 12/16/2018   PLT 148 (L) 12/16/2018   CMP Latest Ref Rng & Units 12/16/2018  Glucose 70 - 99 mg/dL 865(H105(H)  BUN 6 - 20 mg/dL <8(I<5(L)  Creatinine 6.960.44 - 1.00 mg/dL 2.950.57  Sodium 284135 - 132145 mmol/L 137  Potassium 3.5 - 5.1 mmol/L 3.6  Chloride 98 - 111 mmol/L 107  CO2 22 - 32 mmol/L 24  Calcium 8.9 - 10.3 mg/dL 4.4(W6.5(L)  Total Protein 6.5 - 8.1 g/dL 6.0(L)  Total Bilirubin 0.3 - 1.2 mg/dL <1.0(U<0.1(L)  Alkaline Phos 38 - 126 U/L 57  AST 15 - 41 U/L 13(L)  ALT 0 - 44 U/L 8    Discharge instruction: per After Visit Summary and "Baby and Me Booklet".  After visit meds: procardia xl 30 mg daily, labetalol 200 mg BID, IBU, percocet (#30)  Diet: routine diet  Activity: Advance as tolerated. Pelvic rest for 6 weeks.   Outpatient follow up:1 week BP check, 2 weeks incision check Follow up Appt: Future Appointments  Date  Time Provider [place]  12/22/2018  9:35 AM Emily Filbert, MD WOC-WOCA WOC  01/04/2019  2:15 PM [place] NURSE [place] MFC-US  01/04/2019  2:15 PM [place] 4 WH-MFCUS MFC-US  01/05/2019  8:15 AM WOC-WOCA NST WOC-WOCA WOC  01/05/2019  9:15 AM [place] Jude, MD [place]  01/12/2019  8:15 AM WOC-WOCA NST WOC-WOCA WOC  01/19/2019  8:15 AM WOC-WOCA NST WOC-WOCA WOC  01/26/2019  8:15 AM WOC-WOCA NST WOC-WOCA WOC   Follow up Visit: [place] [facility] Follow up.   Specialty: Obstetrics and Gynecology Why: 1 week BP check, 2 week incision check Contact information: [address] [place], [place] [id number] [place] [address] [phone]           Please schedule this patient for Postpartum visit in: 1 week with the following provider: Any provider For C/S patients schedule nurse incision check in weeks 2 weeks: yes High risk pregnancy complicated by: pre eclampsia Delivery mode:  CS Anticipated Birth Control:  Depo provera prior to discharge home    Newborn  Data: Live born female  Birth Weight: 4 lb 8 oz (2040 g) APGAR: 8, 9  Newborn Delivery   Birth date/time: 12/15/2018 12:53:00 Delivery type: C-Section, Low Transverse Trial of labor: No C-section categorization: Primary      Baby Feeding: Breast/pumping Disposition:NICU   12/18/2018 Emily Filbert, MD

## 2018-12-18 NOTE — Progress Notes (Signed)
Patient packed up her own belongings at bedside.

## 2018-12-22 ENCOUNTER — Telehealth: Payer: Medicaid Other | Admitting: Obstetrics & Gynecology

## 2018-12-23 ENCOUNTER — Ambulatory Visit: Payer: Medicaid Other

## 2018-12-24 ENCOUNTER — Ambulatory Visit (INDEPENDENT_AMBULATORY_CARE_PROVIDER_SITE_OTHER): Payer: Medicaid Other

## 2018-12-24 ENCOUNTER — Other Ambulatory Visit: Payer: Self-pay

## 2018-12-24 VITALS — BP 154/102 | HR 68 | Wt 153.8 lb

## 2018-12-24 DIAGNOSIS — Z5189 Encounter for other specified aftercare: Secondary | ICD-10-CM

## 2018-12-24 NOTE — Progress Notes (Signed)
Pt here today for incision check and BP check.  Pt denies any pain or bleeding.  Pt denies sx's of HTN.  Removed old steri strips and observed the incision.  Incision well approximated, no drainage, no odor, and no redness.  BP check 148/101 LA via dynamap.  Recheck BP RA 154/102 manually.  Pt reports that she has not taken her Labetalol 400 mg or Procardia 30 mg this am.  Notified Dr. Harolyn Rutherford who recommended that the pt take her medication and then recheck her BP at home. I informed pt provider recommendation and advised her to please place her BP value in [facility] about an hour after she takes medication and we will be able check her BP value then due to our being closed for the weekend.  Pt verbalized understanding with no further questions.   Mel Almond, RN 12/24/18

## 2018-12-24 NOTE — Progress Notes (Signed)
Patient seen and assessed by nursing staff during this encounter. I have reviewed the chart and agree with the documentation and plan.  Verita Schneiders, MD 12/24/2018 12:31 PM

## 2018-12-27 ENCOUNTER — Telehealth: Payer: Self-pay | Admitting: Obstetrics and Gynecology

## 2018-12-27 ENCOUNTER — Telehealth: Payer: Self-pay | Admitting: *Deleted

## 2018-12-27 NOTE — Telephone Encounter (Signed)
Called pt due to trigger in [employer] with elevated BP 136/96 on 8/14 @ 1815. Pt reports that her BP has been better since then and yesterday the BP was 136/83. Today she denies having H/A or visual disturbances. She endorses taking BP meds daily. Pt reminded to check her BP at least 1 hour after taking BP meds and to continue to record results to [facility] app. She agreed and voiced understanding of instructions.

## 2018-12-27 NOTE — Telephone Encounter (Signed)
Received call from [facility] that patient had BP of 174/110. Was able to reach patient who reports she took her BP meds after she took this blood pressure. Had headache at the time that has now improved. BP while on phone with me was 144/99. Will leave patient at current dose and follow BP over next few days. She is agreeable with plan, cont taking meds as prescribed.   Feliz Beam, M.D. Attending Center for [employer] farm manager)

## 2019-01-04 ENCOUNTER — Ambulatory Visit (HOSPITAL_COMMUNITY): Payer: Medicaid Other

## 2019-01-04 ENCOUNTER — Encounter (HOSPITAL_COMMUNITY): Payer: Self-pay

## 2019-01-05 ENCOUNTER — Encounter: Payer: Medicaid Other | Admitting: Family Medicine

## 2019-01-05 ENCOUNTER — Other Ambulatory Visit: Payer: Medicaid Other

## 2019-01-10 ENCOUNTER — Telehealth: Payer: Self-pay | Admitting: General Practice

## 2019-01-10 NOTE — Telephone Encounter (Signed)
Received email alert from [facility] due to elevated BP over the weekend of 133/103. Per chart review, patient delivered 8/5. Called patient, no answer- left message asking her to sit down for a while to rest and retake her BP as the previous reading seems inaccurate and to put the new reading into [facility].

## 2019-01-11 ENCOUNTER — Telehealth: Payer: Self-pay | Admitting: General Practice

## 2019-01-11 NOTE — Telephone Encounter (Signed)
Received alert from [facility] due to elevated BP 138/104. Called patient, no answer and a message states your call cannot be completed. Per chart review, patient has appt tomorrow for postpartum visit- will be addressed then.

## 2019-01-12 ENCOUNTER — Telehealth: Payer: Medicaid Other | Admitting: Obstetrics and Gynecology

## 2019-01-12 ENCOUNTER — Other Ambulatory Visit: Payer: Self-pay

## 2019-01-12 ENCOUNTER — Other Ambulatory Visit: Payer: Medicaid Other

## 2019-01-12 DIAGNOSIS — Z5329 Procedure and treatment not carried out because of patient's decision for other reasons: Secondary | ICD-10-CM

## 2019-01-12 DIAGNOSIS — Z91199 Patient's noncompliance with other medical treatment and regimen due to unspecified reason: Secondary | ICD-10-CM

## 2019-01-12 NOTE — Progress Notes (Signed)
Called pt twice and was unable to LM due to message stating "please try your call again later".    Mel Almond, RN

## 2019-01-19 ENCOUNTER — Other Ambulatory Visit: Payer: Medicaid Other

## 2019-01-26 ENCOUNTER — Other Ambulatory Visit: Payer: Medicaid Other

## 2019-01-27 ENCOUNTER — Other Ambulatory Visit: Payer: Self-pay

## 2019-01-27 ENCOUNTER — Encounter: Payer: Self-pay | Admitting: Obstetrics & Gynecology

## 2019-01-27 ENCOUNTER — Telehealth (INDEPENDENT_AMBULATORY_CARE_PROVIDER_SITE_OTHER): Payer: Medicaid Other | Admitting: Obstetrics & Gynecology

## 2019-01-27 VITALS — BP 162/121

## 2019-01-27 DIAGNOSIS — O165 Unspecified maternal hypertension, complicating the puerperium: Secondary | ICD-10-CM

## 2019-01-27 MED ORDER — AMLODIPINE BESYLATE 5 MG PO TABS: 5.0000 mg | ORAL_TABLET | Freq: Every day | ORAL | 1 refills | Status: AC

## 2019-01-27 MED ORDER — HYDROCHLOROTHIAZIDE 25 MG PO TABS: 25.0000 mg | ORAL_TABLET | Freq: Every day | ORAL | 3 refills | Status: AC

## 2019-01-27 NOTE — Progress Notes (Signed)
I connected with  Toni Maynard on 01/27/19 at  2:15 PM EDT by telephone and verified that I am speaking with the correct person using two identifiers.   I discussed the limitations, risks, security and privacy concerns of performing an evaluation and management service by telephone and the availability of in person appointments. I also discussed with the patient that there may be a patient responsible charge related to this service. The patient expressed understanding and agreed to proceed.  Annabell Howells, RN 01/27/2019  2:37 PM      TELEHEALTH POSTPARTUM VIRTUAL VIDEO VISIT ENCOUNTER NOTE   Provider location: Center for [employer] at [facility]   I connected with Toni Maynard on 01/27/19 at  2:15 PM EDT by MyChart Video Encounter at home and verified that I am speaking with the correct person using two identifiers.    I discussed the limitations, risks, security and privacy concerns of performing an evaluation and management service virtually and the availability of in person appointments. I also discussed with the patient that there may be a patient responsible charge related to this service. The patient expressed understanding and agreed to proceed.  Chief Complaint: Postpartum Visit  History of Present Illness: Toni Maynard is a 33 y.o. African-American [id number] being evaluated for postpartum followup.    She is s/p primary cesarean section on 12/15/2018 at 33 weeks; she was discharged to home on 8/8//2020 D#2. Pregnancy complicated by elevated. Baby is doing well.  Complains of elevated BP. NO HA or visual changes.   Vaginal bleeding or discharge: Yes  Intercourse: No  Contraception: Depo-Provera Mode of feeding infant: Bottle PP depression s/s: No .  Any bowel or bladder issues: No  Pap smear: no abnormalities (date: 10/14/2018)  Review of Systems: BP elevated prior to pregnancy at some point. Not diagnosed with HTN. Her 12 point review of systems  is negative or as noted in the History of Present Illness.  Patient Active Problem List   Diagnosis Date Noted  . Severe preeclampsia 12/16/2018  . Headache in pregnancy 12/15/2018  . Gestational thrombocytopenia without hemorrhage ([place]) 12/15/2018  . Hypokalemia 12/15/2018  . Pre-eclampsia affecting pregnancy, antepartum 12/14/2018  . Anemia of pregnancy, third trimester 11/08/2018  . Supervision of high risk pregnancy, antepartum 10/20/2018  . Advanced maternal age in multigravida 10/20/2018  . Late prenatal care 10/20/2018  . [place] multiparity 10/20/2018  . Chronic hypertension 10/20/2018  . Smoker 10/20/2018    Medications Talia M. Hopkins had no medications administered during this visit. Current Outpatient Medications  Medication Sig Dispense Refill  . Ferrous Sulfate (IRON PO) Take by mouth.    . labetalol (NORMODYNE) 100 MG tablet Take 2 tablets (200 mg total) by mouth 2 (two) times daily. 60 tablet 3  . NIFEdipine (ADALAT CC) 30 MG 24 hr tablet Take 1 tablet (30 mg total) by mouth daily. 30 tablet 3  . Prenatal Vit-Fe Fumarate-FA (PREPLUS) 27-1 MG TABS Take 1 tablet by mouth daily. 30 tablet 13  . oxyCODONE-acetaminophen (PERCOCET/ROXICET) 5-325 MG tablet Take 1 tablet by mouth every 4 (four) hours as needed. (Patient not taking: Reported on 01/27/2019) 30 tablet 0   No current facility-administered medications for this visit.     Allergies Patient has no known allergies.  Physical Exam:  LMP 06/29/2018 (Approximate)  172/113 Reviewed from Babyscripts General:  Alert, oriented and cooperative. Patient is in no acute distress.  Mental Status: Normal mood and affect. Normal behavior. Normal judgment and thought content.  Respiratory: Normal respiratory effort noted, no problems with respiration noted  Rest of physical exam deferred due to type of encounter  PP Depression Screening:   Edinburgh Postnatal Depression Scale Screening Tool 01/27/2019 12/16/2018  I have been  able to laugh and see the funny side of things. 0 1  I have looked forward with enjoyment to things. 0 0  I have blamed myself unnecessarily when things went wrong. 0 0  I have been anxious or worried for no good reason. 0 0  I have felt scared or panicky for no good reason. 0 1  Things have been getting on top of me. 0 1  I have been so unhappy that I have had difficulty sleeping. 0 1  I have felt sad or miserable. 0 0  I have been so unhappy that I have been crying. 0 1  The thought of harming myself has occurred to me. 0 0  Edinburgh Postnatal Depression Scale Total 0 5     Assessment:Patient is a 33 y.o. [id number] who is 6 weeks postpartum from a primary cesarean section. BP in severe range. Per pt she did have some elevated BPs prior to pregnancy.  This is likely chronic HTN. Pt is still on Labetolol and Procardia. Since she is asymptomatic, will change out her meds and  Follow closely as an outpt.   Plan: Amlodipine 5mg  po q day HCTZ 25 mg po q day   Pt to call with BP on Monday Pt to take her BPs daily .   RTC 1 week   I discussed the assessment and treatment plan with the patient. The patient was provided an opportunity to ask questions and all were answered. The patient agreed with the plan and demonstrated an understanding of the instructions.   The patient was advised to call back or seek an in-person evaluation/go to the ED for any concerning postpartum symptoms.  I provided 17 minutes of face-to-face time during this encounter.   Marjo Bickerachel E Morgan Rennert, RN Center for [facility], [facility]

## 2019-03-16 ENCOUNTER — Ambulatory Visit (INDEPENDENT_AMBULATORY_CARE_PROVIDER_SITE_OTHER): Payer: Medicaid Other

## 2019-03-16 ENCOUNTER — Other Ambulatory Visit: Payer: Self-pay

## 2019-03-16 DIAGNOSIS — Z3042 Encounter for surveillance of injectable contraceptive: Secondary | ICD-10-CM | POA: Diagnosis present

## 2019-03-16 MED ORDER — MEDROXYPROGESTERONE ACETATE 150 MG/ML IM SUSP
150.0000 mg | Freq: Once | INTRAMUSCULAR | Status: AC
  Administered 2019-03-16: 150 mg via INTRAMUSCULAR

## 2019-03-16 NOTE — Progress Notes (Signed)
[place] here for Depo-Provera  Injection.  Injection administered without complication. Patient will return in 3 months for next injection.  Verdell Carmine, RN 03/16/2019  3:28 PM

## 2019-06-01 ENCOUNTER — Ambulatory Visit: Payer: Medicaid Other

## 2019-06-15 ENCOUNTER — Ambulatory Visit (INDEPENDENT_AMBULATORY_CARE_PROVIDER_SITE_OTHER): Payer: Medicaid Other

## 2019-06-15 ENCOUNTER — Other Ambulatory Visit: Payer: Self-pay

## 2019-06-15 VITALS — BP 164/106 | HR 78 | Wt 186.1 lb

## 2019-06-15 DIAGNOSIS — Z3042 Encounter for surveillance of injectable contraceptive: Secondary | ICD-10-CM

## 2019-06-15 MED ORDER — MEDROXYPROGESTERONE ACETATE 150 MG/ML IM SUSP
150.0000 mg | Freq: Once | INTRAMUSCULAR | Status: AC
  Administered 2019-06-15: 11:00:00 150 mg via INTRAMUSCULAR

## 2019-06-15 NOTE — Progress Notes (Signed)
Toni Maynard here for Depo-Provera  Injection.  Injection administered without complication. Patient will return in 3 months for next injection.  Pt presents with elevated BP 173/109.  Pt reports that she just took her HCTZ and Norvasc 5 mg about 20 minutes before arriving to appt.   Notified Dr. Macon Large who requested that pt f/u with her PCP about elevated BP and that she can get her Depo Provera today.  Notified pt provider's recommendation.  Pt verbalized understanding.    Ralene Bathe, RN 06/15/2019  10:56 AM

## 2019-06-15 NOTE — Progress Notes (Signed)
I reviewed the note, was present for Dr. Mont Dutton recommendations and agree with the nursing assessment and plan.   Katrinka Blazing, [place] 08/07/2017 10:32 AM

## 2019-09-05 ENCOUNTER — Ambulatory Visit (INDEPENDENT_AMBULATORY_CARE_PROVIDER_SITE_OTHER): Payer: Medicaid Other

## 2019-09-05 ENCOUNTER — Other Ambulatory Visit: Payer: Self-pay

## 2019-09-05 VITALS — BP 155/100 | HR 98

## 2019-09-05 DIAGNOSIS — Z3042 Encounter for surveillance of injectable contraceptive: Secondary | ICD-10-CM | POA: Diagnosis present

## 2019-09-05 MED ORDER — MEDROXYPROGESTERONE ACETATE 150 MG/ML IM SUSP
150.0000 mg | Freq: Once | INTRAMUSCULAR | Status: AC
  Administered 2019-09-05: 150 mg via INTRAMUSCULAR

## 2019-09-05 MED ORDER — MEDROXYPROGESTERONE ACETATE 150 MG/ML IM SUSP: 150.0000 mg | Freq: Once | INTRAMUSCULAR | Status: DC

## 2019-09-05 NOTE — Progress Notes (Signed)
Toni Maynard here for Depo-Provera  Injection.  Injection administered without complication. Patient will return in 3 months for next injection.  Pt reports that she knows that she needs to contact her PCP to change her medication she just has not gotten around to it.  I strongly advised pt that she needs to call today if possible to get that appt.  Pt verbalized understanding.   Ralene Bathe, RN 09/05/2019  2:24 PM

## 2019-11-21 ENCOUNTER — Ambulatory Visit: Payer: Medicaid Other

## 2019-11-25 ENCOUNTER — Ambulatory Visit: Payer: Medicaid Other

## 2019-12-06 ENCOUNTER — Ambulatory Visit (INDEPENDENT_AMBULATORY_CARE_PROVIDER_SITE_OTHER): Payer: Medicaid Other

## 2019-12-06 ENCOUNTER — Other Ambulatory Visit: Payer: Self-pay

## 2019-12-06 VITALS — BP 156/94 | HR 73 | Ht 67.0 in | Wt 188.4 lb

## 2019-12-06 DIAGNOSIS — Z3042 Encounter for surveillance of injectable contraceptive: Secondary | ICD-10-CM | POA: Diagnosis not present

## 2019-12-06 MED ORDER — MEDROXYPROGESTERONE ACETATE 150 MG/ML IM SUSP
150.0000 mg | Freq: Once | INTRAMUSCULAR | Status: AC
  Administered 2019-12-06: 150 mg via INTRAMUSCULAR

## 2019-12-06 NOTE — Progress Notes (Signed)
Toni Maynard here for Depo-Provera  Injection.  Injection administered without complication. Patient will return in 3 months for next injection.  Ralene Bathe, RN 12/06/2019  2:58 PM

## 2019-12-15 NOTE — Progress Notes (Signed)
Chart reviewed for nurse visit. Agree with plan of care.   Dianely Krehbiel Lorraine, CNM 12/15/2019 1:44 PM   

## 2020-02-21 ENCOUNTER — Ambulatory Visit: Payer: Medicaid Other

## 2020-03-06 ENCOUNTER — Ambulatory Visit (INDEPENDENT_AMBULATORY_CARE_PROVIDER_SITE_OTHER): Payer: Medicaid Other

## 2020-03-06 ENCOUNTER — Other Ambulatory Visit: Payer: Self-pay

## 2020-03-06 VITALS — BP 149/99 | HR 64 | Wt 195.2 lb

## 2020-03-06 DIAGNOSIS — Z3042 Encounter for surveillance of injectable contraceptive: Secondary | ICD-10-CM

## 2020-03-06 NOTE — Progress Notes (Signed)
Toni Maynard Dials here for Depo-Provera Injection. Pt last seen by provider on 01/27/19. Last PAP done 10/14/18, result normal; pt reports hx of abnormal PAP. Per chart review pt has [place] Medicaid [employer] [place], which our office does not accept. Reviewed with assistant manager Mayra Neer, RN who states pt will need to be seen by provider today in order to receive Depo Provera injection; pt will also be responsible for self pay cost. Explained pt will need to see a provider and also pay part of self pay cost today. Offered for pt to schedule annual visit with provider and to request insurance be changed prior to that appt. Pt would like to schedule annual visit. Encouraged pt to use condoms during intercourse until annual visit as this is the last day of her current Depo Provera injection window.  Marjo Bicker, RN 03/06/2020  2:47 PM

## 2020-03-12 ENCOUNTER — Encounter: Payer: Self-pay | Admitting: Nurse Practitioner

## 2020-03-12 NOTE — Progress Notes (Signed)
Chart reviewed for nurse visit. Agree with plan of care.   Marylene Land, CNM 03/12/2020 1:16 PM

## 2020-03-13 ENCOUNTER — Ambulatory Visit: Payer: Medicaid Other | Admitting: Nurse Practitioner

## 2020-05-30 ENCOUNTER — Ambulatory Visit: Payer: Medicaid Other | Admitting: Student

## 2020-12-10 DIAGNOSIS — Z0000 Encounter for general adult medical examination without abnormal findings: Secondary | ICD-10-CM | POA: Diagnosis not present

## 2020-12-10 DIAGNOSIS — E559 Vitamin D deficiency, unspecified: Secondary | ICD-10-CM | POA: Diagnosis not present

## 2020-12-10 DIAGNOSIS — N914 Secondary oligomenorrhea: Secondary | ICD-10-CM | POA: Diagnosis not present

## 2020-12-10 DIAGNOSIS — Z1159 Encounter for screening for other viral diseases: Secondary | ICD-10-CM | POA: Diagnosis not present

## 2020-12-10 DIAGNOSIS — R0602 Shortness of breath: Secondary | ICD-10-CM | POA: Diagnosis not present

## 2020-12-10 DIAGNOSIS — R5383 Other fatigue: Secondary | ICD-10-CM | POA: Diagnosis not present

## 2020-12-10 DIAGNOSIS — I10 Essential (primary) hypertension: Secondary | ICD-10-CM | POA: Diagnosis not present

## 2020-12-10 DIAGNOSIS — Z6828 Body mass index (BMI) 28.0-28.9, adult: Secondary | ICD-10-CM | POA: Diagnosis not present

## 2020-12-12 DIAGNOSIS — Z3042 Encounter for surveillance of injectable contraceptive: Secondary | ICD-10-CM | POA: Diagnosis not present

## 2021-02-18 ENCOUNTER — Emergency Department (HOSPITAL_COMMUNITY)
Admission: EM | Admit: 2021-02-18 | Discharge: 2021-02-18 | Discharge status: Against Medical Advice | Payer: Medicaid Other

## 2021-02-18 NOTE — ED Notes (Signed)
Pt did not answer for triage multiple times.   

## 2021-02-19 ENCOUNTER — Emergency Department (HOSPITAL_BASED_OUTPATIENT_CLINIC_OR_DEPARTMENT_OTHER)
Admission: EM | Admit: 2021-02-19 | Discharge: 2021-02-19 | Disposition: A | Discharge status: Home / Self Care | Payer: Medicaid Other | Attending: Emergency Medicine | Admitting: Emergency Medicine

## 2021-02-19 ENCOUNTER — Other Ambulatory Visit: Payer: Self-pay

## 2021-02-19 ENCOUNTER — Emergency Department (HOSPITAL_BASED_OUTPATIENT_CLINIC_OR_DEPARTMENT_OTHER): Payer: Medicaid Other | Admitting: Radiology

## 2021-02-19 DIAGNOSIS — M25461 Effusion, right knee: Secondary | ICD-10-CM | POA: Diagnosis not present

## 2021-02-19 DIAGNOSIS — W1830XA Fall on same level, unspecified, initial encounter: Secondary | ICD-10-CM | POA: Diagnosis not present

## 2021-02-19 DIAGNOSIS — R609 Edema, unspecified: Secondary | ICD-10-CM | POA: Diagnosis not present

## 2021-02-19 DIAGNOSIS — M25561 Pain in right knee: Secondary | ICD-10-CM | POA: Diagnosis not present

## 2021-02-19 DIAGNOSIS — I10 Essential (primary) hypertension: Secondary | ICD-10-CM | POA: Diagnosis not present

## 2021-02-19 DIAGNOSIS — M7989 Other specified soft tissue disorders: Secondary | ICD-10-CM | POA: Insufficient documentation

## 2021-02-19 DIAGNOSIS — Z79899 Other long term (current) drug therapy: Secondary | ICD-10-CM | POA: Insufficient documentation

## 2021-02-19 DIAGNOSIS — X501XXA Overexertion from prolonged static or awkward postures, initial encounter: Secondary | ICD-10-CM | POA: Insufficient documentation

## 2021-02-19 DIAGNOSIS — F17210 Nicotine dependence, cigarettes, uncomplicated: Secondary | ICD-10-CM | POA: Diagnosis not present

## 2021-02-19 DIAGNOSIS — W19XXXA Unspecified fall, initial encounter: Secondary | ICD-10-CM | POA: Diagnosis not present

## 2021-02-19 MED ORDER — NAPROXEN 500 MG PO TABS: 500.0000 mg | ORAL_TABLET | Freq: Two times a day (BID) | ORAL | 0 refills | Status: AC

## 2021-02-19 MED ORDER — KETOROLAC TROMETHAMINE 30 MG/ML IJ SOLN
30.0000 mg | Freq: Once | INTRAMUSCULAR | Status: AC
  Administered 2021-02-19: 30 mg via INTRAMUSCULAR
  Filled 2021-02-19: qty 1

## 2021-02-19 NOTE — ED Notes (Signed)
Pt from home with right knee pain after a fall last night. Pt states she can't bear weight on it at all and requests crutches. Pt alert & oriented, nad noted.
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
## 2021-02-19 NOTE — Discharge Instructions (Addendum)
Take naproxen twice daily with food and water as needed for pain. Follow-up with orthopedic doctor for additional evaluation. Use the crutches and avoid bearing weight when possible. Wear the knee brace when you are not in the shower

## 2021-02-19 NOTE — ED Notes (Signed)
Pt discharged home after verbalizing understanding of discharge instructions; nad noted. 
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
## 2021-03-27 DIAGNOSIS — Z79899 Other long term (current) drug therapy: Secondary | ICD-10-CM | POA: Diagnosis not present

## 2021-03-27 DIAGNOSIS — Z3042 Encounter for surveillance of injectable contraceptive: Secondary | ICD-10-CM | POA: Diagnosis not present

## 2021-03-27 DIAGNOSIS — Z3200 Encounter for pregnancy test, result unknown: Secondary | ICD-10-CM | POA: Diagnosis not present

## 2021-03-27 DIAGNOSIS — R350 Frequency of micturition: Secondary | ICD-10-CM | POA: Diagnosis not present

## 2021-03-27 DIAGNOSIS — I10 Essential (primary) hypertension: Secondary | ICD-10-CM | POA: Diagnosis not present

## 2021-03-27 DIAGNOSIS — E559 Vitamin D deficiency, unspecified: Secondary | ICD-10-CM | POA: Diagnosis not present

## 2021-03-27 DIAGNOSIS — E7800 Pure hypercholesterolemia, unspecified: Secondary | ICD-10-CM | POA: Diagnosis not present

## 2021-07-12 DIAGNOSIS — Z3042 Encounter for surveillance of injectable contraceptive: Secondary | ICD-10-CM | POA: Diagnosis not present

## 2021-07-12 DIAGNOSIS — E559 Vitamin D deficiency, unspecified: Secondary | ICD-10-CM | POA: Diagnosis not present

## 2021-07-12 DIAGNOSIS — Z3200 Encounter for pregnancy test, result unknown: Secondary | ICD-10-CM | POA: Diagnosis not present

## 2021-07-12 DIAGNOSIS — I10 Essential (primary) hypertension: Secondary | ICD-10-CM | POA: Diagnosis not present

## 2021-07-12 DIAGNOSIS — E7800 Pure hypercholesterolemia, unspecified: Secondary | ICD-10-CM | POA: Diagnosis not present

## 2021-07-12 DIAGNOSIS — Z79899 Other long term (current) drug therapy: Secondary | ICD-10-CM | POA: Diagnosis not present

## 2021-10-11 DIAGNOSIS — E559 Vitamin D deficiency, unspecified: Secondary | ICD-10-CM | POA: Diagnosis not present

## 2021-10-11 DIAGNOSIS — Z79899 Other long term (current) drug therapy: Secondary | ICD-10-CM | POA: Diagnosis not present

## 2021-10-11 DIAGNOSIS — Z3042 Encounter for surveillance of injectable contraceptive: Secondary | ICD-10-CM | POA: Diagnosis not present

## 2021-10-11 DIAGNOSIS — Z6833 Body mass index (BMI) 33.0-33.9, adult: Secondary | ICD-10-CM | POA: Diagnosis not present

## 2021-10-11 DIAGNOSIS — I10 Essential (primary) hypertension: Secondary | ICD-10-CM | POA: Diagnosis not present

## 2021-10-11 DIAGNOSIS — Z3200 Encounter for pregnancy test, result unknown: Secondary | ICD-10-CM | POA: Diagnosis not present

## 2021-10-11 DIAGNOSIS — E7800 Pure hypercholesterolemia, unspecified: Secondary | ICD-10-CM | POA: Diagnosis not present

## 2022-03-30 DIAGNOSIS — R5383 Other fatigue: Secondary | ICD-10-CM | POA: Diagnosis not present

## 2022-03-30 DIAGNOSIS — R0602 Shortness of breath: Secondary | ICD-10-CM | POA: Diagnosis not present

## 2022-03-30 DIAGNOSIS — A499 Bacterial infection, unspecified: Secondary | ICD-10-CM | POA: Diagnosis not present

## 2022-03-30 DIAGNOSIS — I10 Essential (primary) hypertension: Secondary | ICD-10-CM | POA: Diagnosis not present

## 2022-03-30 DIAGNOSIS — Z7251 High risk heterosexual behavior: Secondary | ICD-10-CM | POA: Diagnosis not present

## 2022-03-30 DIAGNOSIS — Z3200 Encounter for pregnancy test, result unknown: Secondary | ICD-10-CM | POA: Diagnosis not present

## 2022-03-30 DIAGNOSIS — Z0000 Encounter for general adult medical examination without abnormal findings: Secondary | ICD-10-CM | POA: Diagnosis not present

## 2022-03-30 DIAGNOSIS — N390 Urinary tract infection, site not specified: Secondary | ICD-10-CM | POA: Diagnosis not present

## 2022-03-30 DIAGNOSIS — E559 Vitamin D deficiency, unspecified: Secondary | ICD-10-CM | POA: Diagnosis not present

## 2022-03-30 DIAGNOSIS — Z6831 Body mass index (BMI) 31.0-31.9, adult: Secondary | ICD-10-CM | POA: Diagnosis not present

## 2022-03-30 DIAGNOSIS — Z1339 Encounter for screening examination for other mental health and behavioral disorders: Secondary | ICD-10-CM | POA: Diagnosis not present

## 2022-03-30 DIAGNOSIS — M79671 Pain in right foot: Secondary | ICD-10-CM | POA: Diagnosis not present

## 2022-03-30 DIAGNOSIS — M79672 Pain in left foot: Secondary | ICD-10-CM | POA: Diagnosis not present

## 2022-04-14 ENCOUNTER — Ambulatory Visit (INDEPENDENT_AMBULATORY_CARE_PROVIDER_SITE_OTHER): Payer: Medicaid Other | Admitting: Podiatry

## 2022-04-14 ENCOUNTER — Ambulatory Visit (INDEPENDENT_AMBULATORY_CARE_PROVIDER_SITE_OTHER): Payer: Medicaid Other

## 2022-04-14 DIAGNOSIS — M7742 Metatarsalgia, left foot: Secondary | ICD-10-CM

## 2022-04-14 DIAGNOSIS — M7741 Metatarsalgia, right foot: Secondary | ICD-10-CM | POA: Diagnosis not present

## 2022-04-14 NOTE — Progress Notes (Signed)
   Chief Complaint  Patient presents with   Foot Pain    On going for 2 months , pain varies but it is a throbbing pain. No swelling or redness. Patient states pain is mainly at the ball of both feet.    HPI: 36 y.o. female presenting today as a new patient for evaluation of general achiness and pain to the bilateral forefoot.  Denies a history of injury.  She says that over the past few months she has noticed increased pain and tenderness associated to the forefoot.  She has not done anything for treatment.  Past Medical History:  Diagnosis Date   Hypertension    2017 and 2019 pregnancies   Preterm labor    Induced for ghtn    Past Surgical History:  Procedure Laterality Date   CESAREAN SECTION N/A 12/15/2018   Procedure: CESAREAN SECTION;  Surgeon: Kathrynn Running, MD;  Location: [facility] LD ORS;  Service: Obstetrics;  Laterality: N/A;   NO PAST SURGERIES      No Known Allergies   Physical Exam: General: The patient is alert and oriented x3 in no acute distress.  Dermatology: Skin is warm, dry and supple bilateral lower extremities. Negative for open lesions or macerations.  Vascular: Palpable pedal pulses bilaterally. Capillary refill within normal limits.  Negative for any significant edema or erythema  Neurological: Light touch and protective threshold grossly intact  Musculoskeletal Exam: No pedal deformities noted.  There is pain on palpation throughout the bilateral forefoot along the metatarsal heads.  No pain with palpation to the first MTP  Radiographic Exam B/L feet 04/14/2022:  Normal osseous mineralization. Joint spaces preserved. No fracture/dislocation/boney destruction.  The first metatarsals do appear to be somewhat short compared to the remaining metatarsals and the metatarsal parabola.  This likely is creating excessive pressure on the lesser metatarsal heads  Assessment: 1.  Metatarsalgia bilateral   Plan of Care:  1. Patient evaluated. X-Rays reviewed.   2.  Recommend good supportive shoes with arch supports to offload pressure from the forefoot.  Advised against going barefoot 3.  For now pursue conservative treatment.  Recommend OTC NSAIDs as needed 4.  Return to clinic as needed      Felecia Shelling, DPM [facility]  Dr. Felecia Shelling, DPM    [address] [place], [place] [address]                Office [phone]  Fax [phone]

## 2022-06-30 DIAGNOSIS — Z3042 Encounter for surveillance of injectable contraceptive: Secondary | ICD-10-CM | POA: Diagnosis not present

## 2022-06-30 DIAGNOSIS — Z3200 Encounter for pregnancy test, result unknown: Secondary | ICD-10-CM | POA: Diagnosis not present

## 2022-06-30 DIAGNOSIS — E559 Vitamin D deficiency, unspecified: Secondary | ICD-10-CM | POA: Diagnosis not present

## 2023-03-09 DIAGNOSIS — A499 Bacterial infection, unspecified: Secondary | ICD-10-CM | POA: Diagnosis not present

## 2023-03-09 DIAGNOSIS — Z3200 Encounter for pregnancy test, result unknown: Secondary | ICD-10-CM | POA: Diagnosis not present

## 2023-03-09 DIAGNOSIS — N390 Urinary tract infection, site not specified: Secondary | ICD-10-CM | POA: Diagnosis not present

## 2023-03-09 DIAGNOSIS — R300 Dysuria: Secondary | ICD-10-CM | POA: Diagnosis not present

## 2023-03-09 DIAGNOSIS — Z3042 Encounter for surveillance of injectable contraceptive: Secondary | ICD-10-CM | POA: Diagnosis not present

## 2023-04-17 ENCOUNTER — Telehealth (HOSPITAL_COMMUNITY): Payer: Self-pay | Admitting: *Deleted

## 2023-04-17 ENCOUNTER — Ambulatory Visit (HOSPITAL_COMMUNITY)
Admission: EM | Admit: 2023-04-17 | Discharge: 2023-04-17 | Disposition: A | Discharge status: Home / Self Care | Payer: Medicaid Other | Attending: Emergency Medicine | Admitting: Emergency Medicine

## 2023-04-17 ENCOUNTER — Encounter (HOSPITAL_COMMUNITY): Payer: Self-pay

## 2023-04-17 DIAGNOSIS — J020 Streptococcal pharyngitis: Secondary | ICD-10-CM

## 2023-04-17 LAB — POCT RAPID STREP A (OFFICE): Rapid Strep A Screen: POSITIVE — AB

## 2023-04-17 MED ORDER — AMOXICILLIN 500 MG PO CAPS: 500.0000 mg | ORAL_CAPSULE | Freq: Two times a day (BID) | ORAL | 0 refills | Status: DC

## 2023-04-17 MED ORDER — AMOXICILLIN 500 MG PO CAPS: 500.0000 mg | ORAL_CAPSULE | Freq: Two times a day (BID) | ORAL | 0 refills | Status: AC

## 2023-04-17 MED ORDER — PREDNISOLONE SODIUM PHOSPHATE 15 MG/5ML PO SOLN
ORAL | Status: AC
  Filled 2023-04-17: qty 3

## 2023-04-17 MED ORDER — PREDNISOLONE SODIUM PHOSPHATE 15 MG/5ML PO SOLN
40.0000 mg | Freq: Once | ORAL | Status: AC
  Administered 2023-04-17: 40 mg via ORAL

## 2023-04-17 NOTE — ED Triage Notes (Addendum)
Pt presents with sore throat x 4 days. Pt currently rates her throat pain an 8/10. Pt states she has tried Aleve + Chloraseptic spray with little (temporary) improvement. Pt also reports a non-productive cough & right ear pain as well. Pt also mentioned drinking tea + honey.   Pt denies taking her BP medications today -- 178/106.

## 2023-04-17 NOTE — Discharge Instructions (Signed)
Take all antibiotics as prescribed and until finished, you can take them with food to prevent gastrointestinal upset.  Alternate between 800 mg of ibuprofen and 500 mg of Tylenol every 4-6 hours for fevers and pain.  You can also do warm saline gargles, tea with honey and cold foods like popsicles.  Your blood pressure was quite elevated today, ensure you are taking your blood pressure medication as prescribed.  Return to clinic for any new or urgent symptoms.

## 2023-04-17 NOTE — ED Provider Notes (Signed)
[facility]    CSN: [id number] Arrival date & time: 04/17/23  1546      History   Chief Complaint Chief Complaint  Patient presents with   Sore Throat    HPI Toni Maynard is a 37 y.o. female.   Patient presents to clinic complaining of a sore throat and right-sided ear pain that has been present for the past 4 days.  She has tried Aleve and Chloraseptic spray as well as tea with honey.  She does not really have a cough.  Denies any fevers.  No congestion.  No abdominal pain, nausea or vomiting.  She reports she does have her blood pressure medication at home she just has not taken it today.  Has not had any vision changes or headache.  No recent known sick contacts.   The history is provided by the patient and medical records.  Sore Throat    Past Medical History:  Diagnosis Date   Hypertension    2017 and 2019 pregnancies   Preterm labor    Induced for ghtn    Patient Active Problem List   Diagnosis Date Noted   History of severe pre-eclampsia 12/16/2018   Headache in pregnancy 12/15/2018   Gestational thrombocytopenia without hemorrhage (HCC) 12/15/2018   Hypokalemia 12/15/2018   Pre-eclampsia affecting pregnancy, antepartum 12/14/2018   Anemia of pregnancy, third trimester 11/08/2018   Supervision of high risk pregnancy, antepartum 10/20/2018   Advanced maternal age in multigravida 10/20/2018   Late prenatal care 10/20/2018   Grand multiparity 10/20/2018   Chronic hypertension 10/20/2018   Smoker 10/20/2018    Past Surgical History:  Procedure Laterality Date   CESAREAN SECTION N/A 12/15/2018   Procedure: CESAREAN SECTION;  Surgeon: Kathrynn Running, MD;  Location: [facility];  Service: Obstetrics;  Laterality: N/A;   NO PAST SURGERIES      OB History     Gravida  9   Para  9   Term  7   Preterm  2   AB  0   Living  9      SAB  0   IAB  0   Ectopic  0   Multiple  0   Live Births  9            Home  Medications    Prior to Admission medications   Medication Sig Start Date End Date Taking? Authorizing Provider  amoxicillin (AMOXIL) 500 MG capsule Take 1 capsule (500 mg total) by mouth 2 (two) times daily for 10 days. 04/17/23 04/27/23 Yes Rinaldo Ratel, [place], FNP  amLODipine (NORVASC) 5 MG tablet Take 1 tablet (5 mg total) by mouth daily. 01/27/19   Willodean Rosenthal, MD  Ferrous Sulfate (IRON PO) Take by mouth.    [provider]  hydrochlorothiazide (HYDRODIURIL) 25 MG tablet Take 1 tablet (25 mg total) by mouth daily. 01/27/19   Willodean Rosenthal, MD  naproxen (NAPROSYN) 500 MG tablet Take 1 tablet (500 mg total) by mouth 2 (two) times daily. 02/19/21   Theron Arista, PA-C    Family History History reviewed. No pertinent family history.  Social History Social History   Tobacco Use   Smoking status: Every Day    Current packs/day: 0.25    Types: Cigarettes   Smokeless tobacco: Never  Vaping Use   Vaping status: Never Used  Substance Use Topics   Alcohol use: Not Currently   Drug use: Not Currently     Allergies   Patient  has no known allergies.   Review of Systems Review of Systems  Per HPI   Physical Exam Triage Vital Signs ED Triage Vitals  Encounter Vitals Group     BP 04/17/23 1730 (!) 178/106     Systolic BP Percentile --      Diastolic BP Percentile --      Pulse Rate 04/17/23 1730 74     Resp 04/17/23 1730 18     Temp 04/17/23 1730 99.1 F (37.3 C)     Temp Source 04/17/23 1730 Oral     SpO2 04/17/23 1730 97 %     Weight --      Height --      Head Circumference --      Peak Flow --      Pain Score 04/17/23 1728 8     Pain Loc --      Pain Education --      Exclude from Growth Chart --    No data found.  Updated Vital Signs BP (!) 178/106 (BP Location: Right Arm)   Pulse 74   Temp 99.1 F (37.3 C) (Oral)   Resp 18   LMP 02/20/2023 (Exact Date)   SpO2 97%   Visual Acuity Right Eye Distance:   Left Eye Distance:    Bilateral Distance:    Right Eye Near:   Left Eye Near:    Bilateral Near:     Physical Exam Vitals and nursing note reviewed.  Constitutional:      Appearance: She is well-developed.  HENT:     Head: Normocephalic and atraumatic.     Right Ear: Tympanic membrane and ear canal normal.     Left Ear: Tympanic membrane and ear canal normal.     Nose: No congestion or rhinorrhea.     Mouth/Throat:     Mouth: Mucous membranes are moist.     Pharynx: Uvula midline. Posterior oropharyngeal erythema present.     Tonsils: No tonsillar exudate or tonsillar abscesses. 2+ on the right. 2+ on the left.  Cardiovascular:     Rate and Rhythm: Normal rate.  Pulmonary:     Effort: Pulmonary effort is normal. No respiratory distress.  Skin:    General: Skin is warm and dry.  Neurological:     General: No focal deficit present.     Mental Status: She is alert.  Psychiatric:        Mood and Affect: Mood normal.      UC Treatments / Results  Labs (all labs ordered are listed, but only abnormal results are displayed) Labs Reviewed  POCT RAPID STREP A (OFFICE) - Abnormal; Notable for the following components:      Result Value   Rapid Strep A Screen Positive (*)    All other components within normal limits    EKG   Radiology No results found.  Procedures Procedures (including critical care time)  Medications Ordered in UC Medications  prednisoLONE (ORAPRED) 15 MG/5ML solution 40 mg (has no administration in time range)    Initial Impression / Assessment and Plan / UC Course  I have reviewed the triage vital signs and the nursing notes.  Pertinent labs & imaging results that were available during my care of the patient were reviewed by me and considered in my medical decision making (see chart for details).  Vitals and triage reviewed, patient is hemodynamically stable.  Posterior pharynx with erythema and tonsils with 2+ edema bilaterally, no exudate.  Uvula midline.  POC  rapid strep positive, will treat with amoxicillin.  One-time dose of oral steroid given in clinic for pain and inflammation.  Plan of care, follow-up care and return precautions given, no questions at this time.     Final Clinical Impressions(s) / UC Diagnoses   Final diagnoses:  Strep pharyngitis     Discharge Instructions      Take all antibiotics as prescribed and until finished, you can take them with food to prevent gastrointestinal upset.  Alternate between 800 mg of ibuprofen and 500 mg of Tylenol every 4-6 hours for fevers and pain.  You can also do warm saline gargles, tea with honey and cold foods like popsicles.  Your blood pressure was quite elevated today, ensure you are taking your blood pressure medication as prescribed.  Return to clinic for any new or urgent symptoms.      ED Prescriptions     Medication Sig Dispense Auth. Provider   amoxicillin (AMOXIL) 500 MG capsule Take 1 capsule (500 mg total) by mouth 2 (two) times daily for 10 days. 20 capsule Adonai Helzer, [place], [place]      PDMP not reviewed this encounter.   Harkirat Orozco, [place] N, [place] 04/17/23 [phone]

## 2023-05-24 IMAGING — DX DG KNEE COMPLETE 4+V*R*
4 series · 4 of 4 positions shown · non-contrast
Comparison: None.

CLINICAL DATA: Knee pain

EXAM:
RIGHT KNEE - COMPLETE 4+ VIEW

[knee ap]
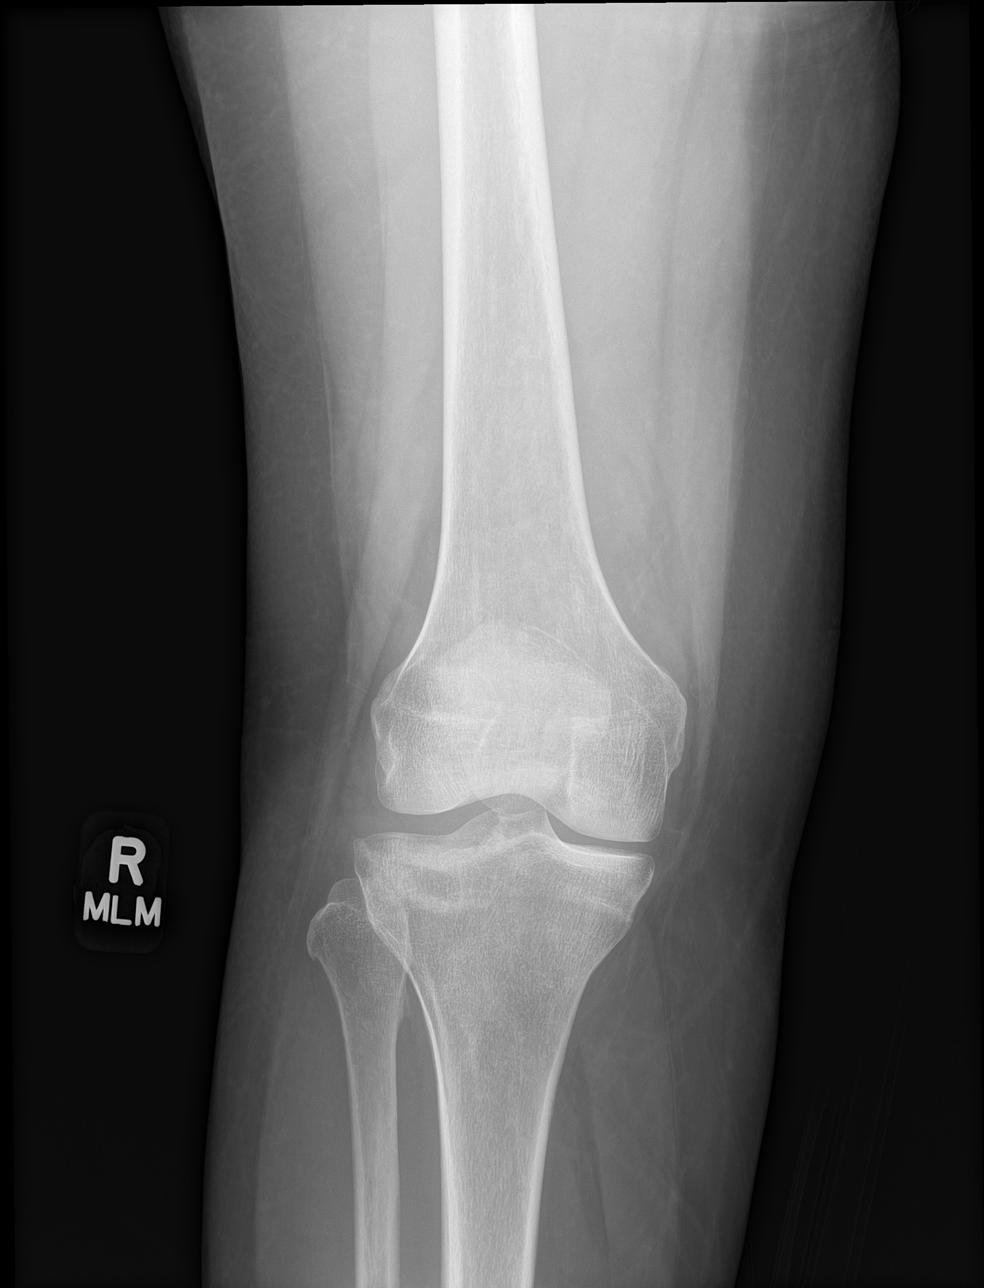

[knee obl (1 of 2)]
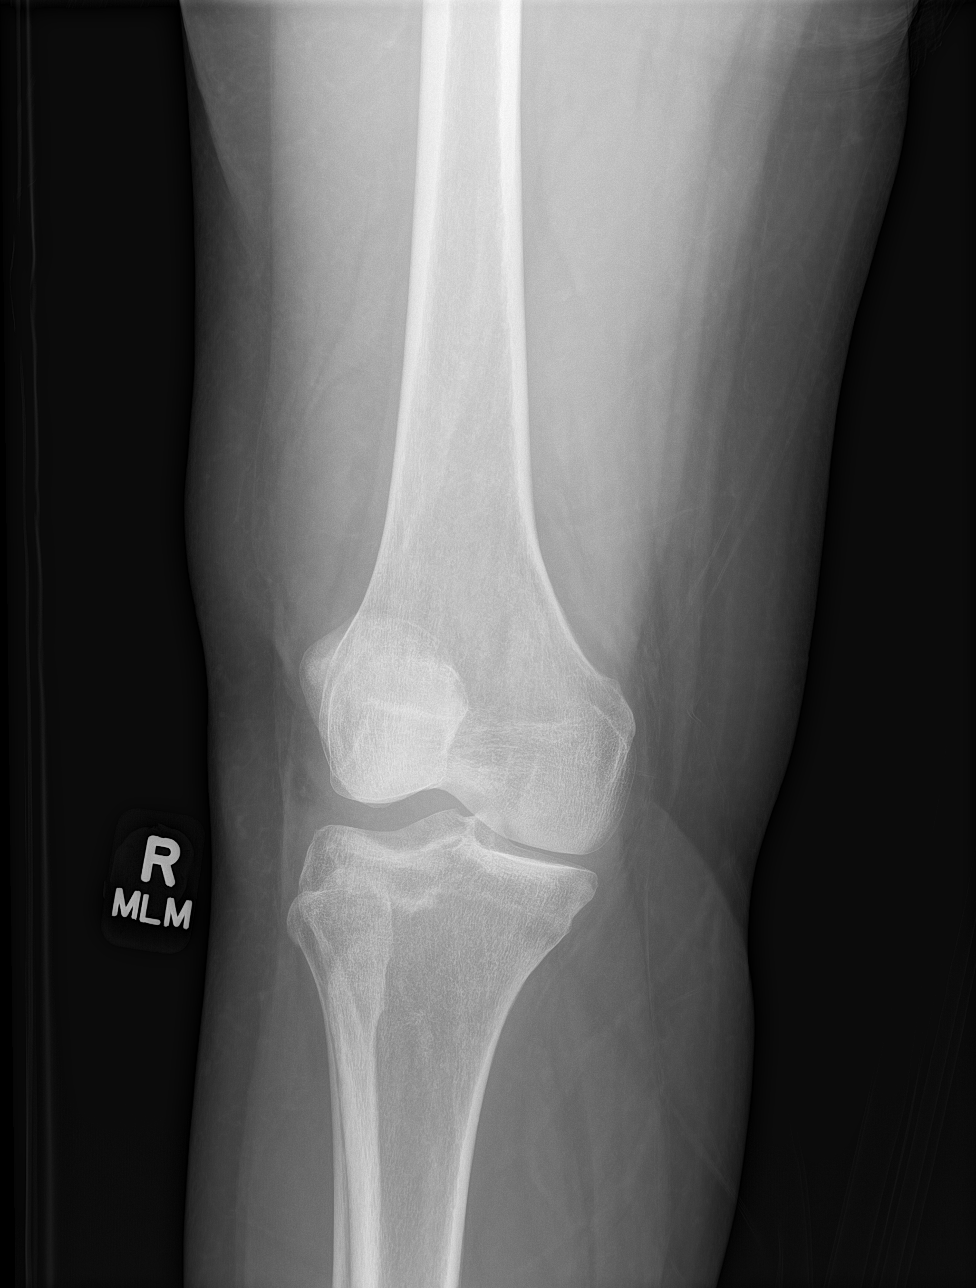

[knee obl (2 of 2)]
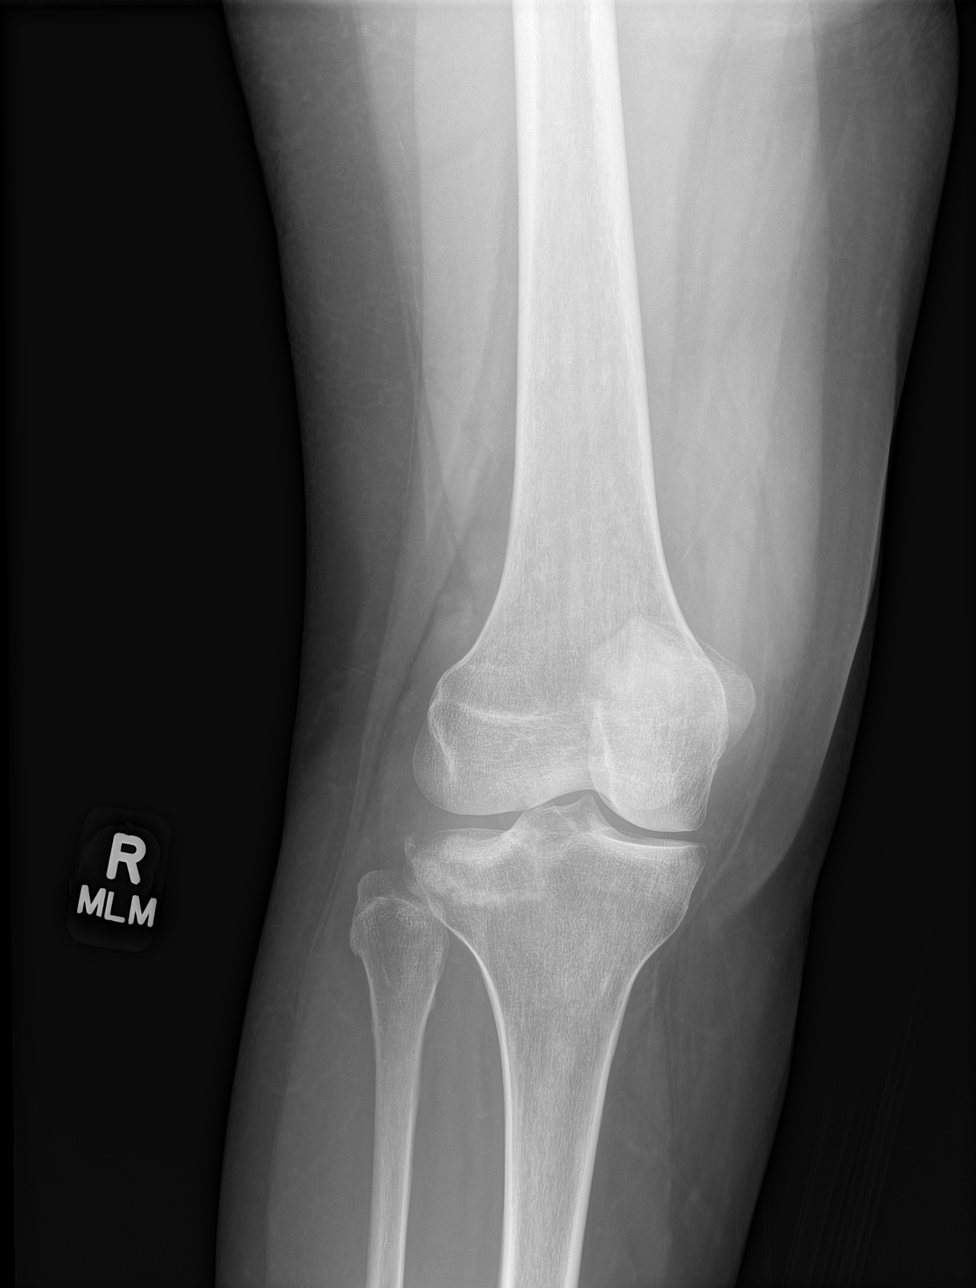

[knee lat]
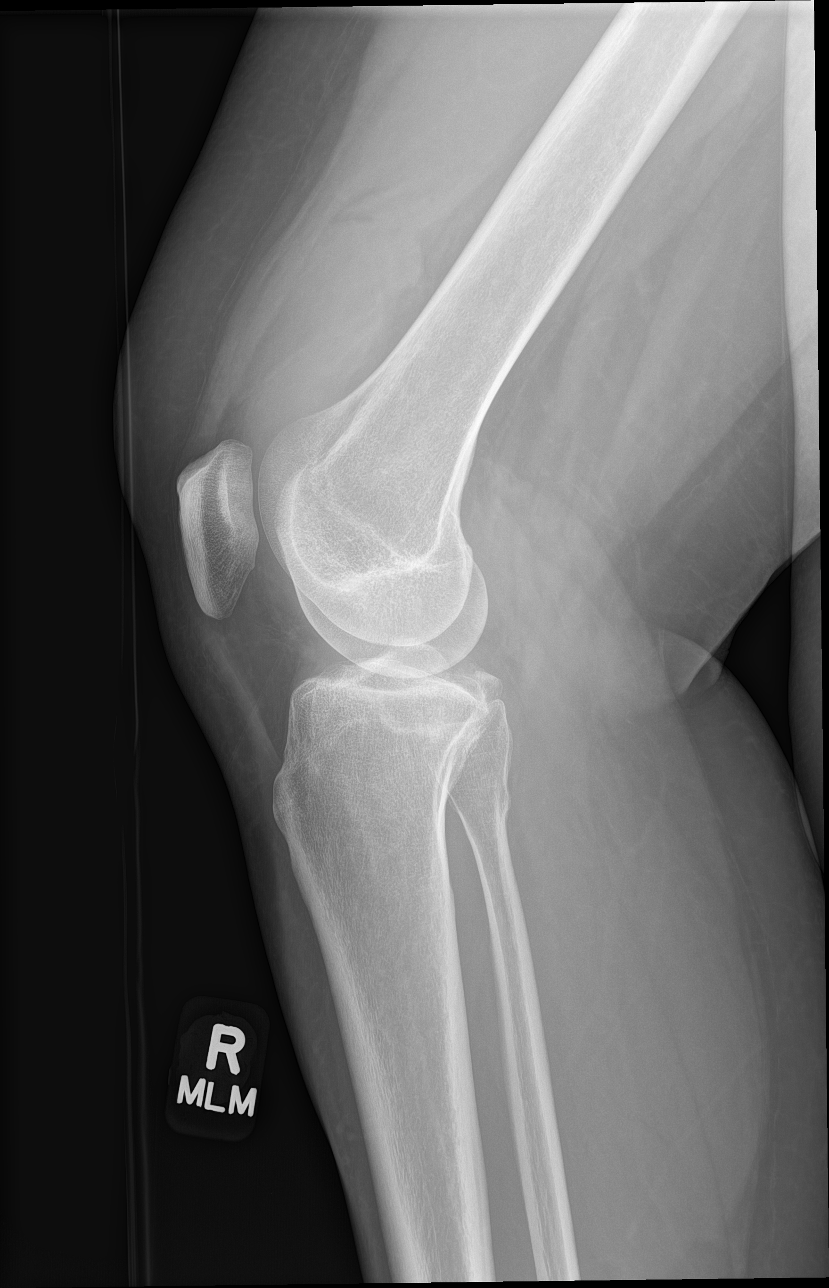

[4 of 4 positions shown; findings below may reference images not displayed]

FINDINGS: No evidence of fracture or dislocation. Moderate joint effusion. No
evidence of arthropathy or other focal bone abnormality. Soft
tissues are unremarkable.
IMPRESSION: No acute osseous abnormality.

Moderate right knee joint effusion.

## 2023-06-18 DIAGNOSIS — R300 Dysuria: Secondary | ICD-10-CM | POA: Diagnosis not present

## 2023-06-18 DIAGNOSIS — Z3200 Encounter for pregnancy test, result unknown: Secondary | ICD-10-CM | POA: Diagnosis not present

## 2023-06-18 DIAGNOSIS — Z3042 Encounter for surveillance of injectable contraceptive: Secondary | ICD-10-CM | POA: Diagnosis not present

## 2023-08-11 DIAGNOSIS — N760 Acute vaginitis: Secondary | ICD-10-CM | POA: Diagnosis not present

## 2023-08-11 DIAGNOSIS — B9689 Other specified bacterial agents as the cause of diseases classified elsewhere: Secondary | ICD-10-CM | POA: Diagnosis not present

## 2023-08-11 DIAGNOSIS — R0602 Shortness of breath: Secondary | ICD-10-CM | POA: Diagnosis not present

## 2023-08-11 DIAGNOSIS — R87610 Atypical squamous cells of undetermined significance on cytologic smear of cervix (ASC-US): Secondary | ICD-10-CM | POA: Diagnosis not present

## 2023-08-11 DIAGNOSIS — Z1331 Encounter for screening for depression: Secondary | ICD-10-CM | POA: Diagnosis not present

## 2023-08-11 DIAGNOSIS — Z0000 Encounter for general adult medical examination without abnormal findings: Secondary | ICD-10-CM | POA: Diagnosis not present

## 2023-08-11 DIAGNOSIS — I10 Essential (primary) hypertension: Secondary | ICD-10-CM | POA: Diagnosis not present

## 2023-08-11 DIAGNOSIS — N898 Other specified noninflammatory disorders of vagina: Secondary | ICD-10-CM | POA: Diagnosis not present

## 2023-08-11 DIAGNOSIS — R5383 Other fatigue: Secondary | ICD-10-CM | POA: Diagnosis not present

## 2023-08-11 DIAGNOSIS — E559 Vitamin D deficiency, unspecified: Secondary | ICD-10-CM | POA: Diagnosis not present

## 2023-08-11 DIAGNOSIS — Z1339 Encounter for screening examination for other mental health and behavioral disorders: Secondary | ICD-10-CM | POA: Diagnosis not present

## 2023-08-26 DIAGNOSIS — I10 Essential (primary) hypertension: Secondary | ICD-10-CM | POA: Diagnosis not present

## 2023-08-26 DIAGNOSIS — E7800 Pure hypercholesterolemia, unspecified: Secondary | ICD-10-CM | POA: Diagnosis not present

## 2023-08-26 DIAGNOSIS — R87820 Cervical low risk human papillomavirus (HPV) DNA test positive: Secondary | ICD-10-CM | POA: Diagnosis not present

## 2023-08-26 DIAGNOSIS — E559 Vitamin D deficiency, unspecified: Secondary | ICD-10-CM | POA: Diagnosis not present

## 2023-08-26 DIAGNOSIS — R87610 Atypical squamous cells of undetermined significance on cytologic smear of cervix (ASC-US): Secondary | ICD-10-CM | POA: Diagnosis not present

## 2023-08-26 DIAGNOSIS — R0602 Shortness of breath: Secondary | ICD-10-CM | POA: Diagnosis not present

## 2023-12-02 DIAGNOSIS — E7800 Pure hypercholesterolemia, unspecified: Secondary | ICD-10-CM | POA: Diagnosis not present

## 2023-12-02 DIAGNOSIS — Z3200 Encounter for pregnancy test, result unknown: Secondary | ICD-10-CM | POA: Diagnosis not present

## 2023-12-02 DIAGNOSIS — E559 Vitamin D deficiency, unspecified: Secondary | ICD-10-CM | POA: Diagnosis not present

## 2023-12-02 DIAGNOSIS — Z1231 Encounter for screening mammogram for malignant neoplasm of breast: Secondary | ICD-10-CM | POA: Diagnosis not present

## 2023-12-02 DIAGNOSIS — I10 Essential (primary) hypertension: Secondary | ICD-10-CM | POA: Diagnosis not present

## 2023-12-02 DIAGNOSIS — Z3042 Encounter for surveillance of injectable contraceptive: Secondary | ICD-10-CM | POA: Diagnosis not present

## 2023-12-02 DIAGNOSIS — F17210 Nicotine dependence, cigarettes, uncomplicated: Secondary | ICD-10-CM | POA: Diagnosis not present
